# Patient Record
Sex: Female | Born: 1960
Health system: Southern US, Community
[De-identification: ages and names within clinical notes are randomized; demographics above are authoritative.]

---

## 2004-07-14 ENCOUNTER — Ambulatory Visit (HOSPITAL_BASED_OUTPATIENT_CLINIC_OR_DEPARTMENT_OTHER): Admission: RE | Admit: 2004-07-14 | Discharge: 2004-07-14 | Payer: Self-pay | Admitting: Urology

## 2004-07-14 ENCOUNTER — Ambulatory Visit (HOSPITAL_COMMUNITY): Admission: RE | Admit: 2004-07-14 | Discharge: 2004-07-14 | Payer: Self-pay | Admitting: Urology

## 2004-08-28 ENCOUNTER — Other Ambulatory Visit: Admission: RE | Admit: 2004-08-28 | Discharge: 2004-08-28 | Payer: Self-pay | Admitting: *Deleted

## 2005-09-10 ENCOUNTER — Other Ambulatory Visit: Admission: RE | Admit: 2005-09-10 | Discharge: 2005-09-10 | Payer: Self-pay | Admitting: *Deleted

## 2006-10-07 ENCOUNTER — Other Ambulatory Visit: Admission: RE | Admit: 2006-10-07 | Discharge: 2006-10-07 | Payer: Self-pay | Admitting: *Deleted

## 2007-10-13 ENCOUNTER — Other Ambulatory Visit: Admission: RE | Admit: 2007-10-13 | Discharge: 2007-10-13 | Payer: Self-pay | Admitting: *Deleted

## 2007-11-12 ENCOUNTER — Other Ambulatory Visit: Admission: RE | Admit: 2007-11-12 | Discharge: 2007-11-12 | Payer: Self-pay | Admitting: *Deleted

## 2008-05-10 ENCOUNTER — Other Ambulatory Visit: Admission: RE | Admit: 2008-05-10 | Discharge: 2008-05-10 | Payer: Self-pay | Admitting: Gynecology

## 2008-10-28 ENCOUNTER — Other Ambulatory Visit: Admission: RE | Admit: 2008-10-28 | Discharge: 2008-10-28 | Payer: Self-pay | Admitting: Gynecology

## 2010-11-05 ENCOUNTER — Encounter: Payer: Self-pay | Admitting: Family Medicine

## 2011-03-02 NOTE — Op Note (Signed)
NAMEMERON, BOCCHINO                ACCOUNT NO.:  [id number]   MEDICAL RECORD NO.:  [id number]          PATIENT TYPE:  AMB   LOCATION:  [facility]                         FACILITY:  [facility]   PHYSICIAN:  Jamison Neighbor, M.D.  DATE OF BIRTH:  01-23-1961   DATE OF PROCEDURE:  07/14/2004  DATE OF DISCHARGE:                                 OPERATIVE REPORT   SERVICE:  Urology.   PREOPERATIVE DIAGNOSES:  Interstitial cystitis.   POSTOPERATIVE DIAGNOSES:  Interstitial cystitis.   PROCEDURE:  Cystoscopy, urethral calibration, hydrodistention of the  bladder, Marcaine and Pyridium installation, Marcaine and Kenalog injection.   SURGEON:  Jamison Neighbor, M.D.   ANESTHESIA:  General.   COMPLICATIONS:  None.   DRAINS:  None.   BRIEF HISTORY:  This 50 year old female has had problems with lower urinary  tract symptoms of urgency, frequency and pain without positive cultures. The  patient was told in [place], [place] that she might have interstitial  cystitis and was given long-term antibiotics. She thought she might feel  better but clearly did not have any major improvement in her symptoms. Her  story did seem to be consistent with interstitial cystitis. She is  interested in diagnostic cystoscopy.  She understands the risks and benefits  of the procedure including the fact that she will certainly feel worse for  the first few days after the procedure and that there is no guarantee that  this will be helpful in terms of her long-term pain management. She gave  full and informed consent.   DESCRIPTION OF PROCEDURE:  After successful induction of general anesthesia,  the patient was placed in the dorsal lithotomy, prepped with Betadine and  draped in the usual sterile fashion. Careful bimanual examination was  performed. The patient had no significant cystocele, rectocele, or  enterocele. There was no prolapse, there was no discharge of any kind from  the vagina or urethra. There were no  masses on bimanual exam, there was no  signs of diverticulum. The urethra was calibrated to 46 [place] with no  evidence of stenosis or stricture. The cystoscope was inserted and the  bladder was carefully inspected and was free of any tumor or stones. Both  ureteral orifices were normal in configuration and location. Hydrodistention  of the bladder was then performed and the bladder was filled at a pressure  of 100 cmH2O for 5 minutes. When the bladder was drained, glomerulations  could be seen throughout the bladder and there was a terminal blood tinge at  the end of the drainage cycle and this had the classic appearance of  interstitial cystitis. What is remarkable, however, is that the patient has  a bladder capacity of 1300 mL which is significantly higher than even a  normal bladder capacity and obviously much higher than one generally sees  with this disease. Based on the patient's symptoms and the glomerulations,  one could certainly make a diagnosis of IC but obviously this is in the  early stages and is not a significant problem due to the normal bladder  capacity. The bladder  was  drained, a mixture of Marcaine and Pyridium was left within the bladder,  Marcaine and Kenalog were injected periurethrally. The patient received  intraoperative Toradol, Zofran and a B&O suppository. She will be sent home  with Tylox, Pyridium plus and doxycycline and return to see me in followup  in 2-3 weeks.      RJE/MEDQ  D:  07/14/2004  T:  07/14/2004  Job:  [id number]

## 2011-10-02 ENCOUNTER — Ambulatory Visit: Payer: BC Managed Care – PPO | Attending: Internal Medicine | Admitting: Physical Therapy

## 2011-10-02 DIAGNOSIS — R42 Dizziness and giddiness: Secondary | ICD-10-CM | POA: Insufficient documentation

## 2011-10-02 DIAGNOSIS — R269 Unspecified abnormalities of gait and mobility: Secondary | ICD-10-CM | POA: Insufficient documentation

## 2011-10-02 DIAGNOSIS — IMO0001 Reserved for inherently not codable concepts without codable children: Secondary | ICD-10-CM | POA: Insufficient documentation

## 2011-10-23 ENCOUNTER — Encounter: Payer: BC Managed Care – PPO | Admitting: Physical Therapy

## 2014-12-10 ENCOUNTER — Other Ambulatory Visit: Payer: Self-pay | Admitting: Internal Medicine

## 2014-12-10 ENCOUNTER — Ambulatory Visit
Admission: RE | Admit: 2014-12-10 | Discharge: 2014-12-10 | Disposition: A | Discharge status: Home / Self Care | Payer: BLUE CROSS/BLUE SHIELD | Source: Ambulatory Visit | Attending: Internal Medicine | Admitting: Internal Medicine

## 2014-12-10 DIAGNOSIS — M25551 Pain in right hip: Secondary | ICD-10-CM

## 2016-03-03 DIAGNOSIS — T07 Unspecified multiple injuries: Secondary | ICD-10-CM | POA: Diagnosis not present

## 2016-03-03 DIAGNOSIS — W57XXXA Bitten or stung by nonvenomous insect and other nonvenomous arthropods, initial encounter: Secondary | ICD-10-CM | POA: Diagnosis not present

## 2016-04-13 DIAGNOSIS — E785 Hyperlipidemia, unspecified: Secondary | ICD-10-CM | POA: Diagnosis not present

## 2016-04-19 DIAGNOSIS — E7800 Pure hypercholesterolemia, unspecified: Secondary | ICD-10-CM | POA: Diagnosis not present

## 2016-05-15 DIAGNOSIS — M6281 Muscle weakness (generalized): Secondary | ICD-10-CM | POA: Diagnosis not present

## 2016-05-15 DIAGNOSIS — M7711 Lateral epicondylitis, right elbow: Secondary | ICD-10-CM | POA: Diagnosis not present

## 2016-05-15 DIAGNOSIS — M7702 Medial epicondylitis, left elbow: Secondary | ICD-10-CM | POA: Diagnosis not present

## 2016-05-22 DIAGNOSIS — M7711 Lateral epicondylitis, right elbow: Secondary | ICD-10-CM | POA: Diagnosis not present

## 2016-05-22 DIAGNOSIS — M7702 Medial epicondylitis, left elbow: Secondary | ICD-10-CM | POA: Diagnosis not present

## 2016-05-22 DIAGNOSIS — M6281 Muscle weakness (generalized): Secondary | ICD-10-CM | POA: Diagnosis not present

## 2016-05-23 DIAGNOSIS — Z6825 Body mass index (BMI) 25.0-25.9, adult: Secondary | ICD-10-CM | POA: Diagnosis not present

## 2016-05-23 DIAGNOSIS — Z01419 Encounter for gynecological examination (general) (routine) without abnormal findings: Secondary | ICD-10-CM | POA: Diagnosis not present

## 2016-05-24 DIAGNOSIS — M7711 Lateral epicondylitis, right elbow: Secondary | ICD-10-CM | POA: Diagnosis not present

## 2016-05-24 DIAGNOSIS — M6281 Muscle weakness (generalized): Secondary | ICD-10-CM | POA: Diagnosis not present

## 2016-05-24 DIAGNOSIS — M7702 Medial epicondylitis, left elbow: Secondary | ICD-10-CM | POA: Diagnosis not present

## 2016-05-28 DIAGNOSIS — M7702 Medial epicondylitis, left elbow: Secondary | ICD-10-CM | POA: Diagnosis not present

## 2016-05-28 DIAGNOSIS — M7711 Lateral epicondylitis, right elbow: Secondary | ICD-10-CM | POA: Diagnosis not present

## 2016-05-28 DIAGNOSIS — M6281 Muscle weakness (generalized): Secondary | ICD-10-CM | POA: Diagnosis not present

## 2016-05-31 DIAGNOSIS — M7711 Lateral epicondylitis, right elbow: Secondary | ICD-10-CM | POA: Diagnosis not present

## 2016-05-31 DIAGNOSIS — M6281 Muscle weakness (generalized): Secondary | ICD-10-CM | POA: Diagnosis not present

## 2016-05-31 DIAGNOSIS — M7702 Medial epicondylitis, left elbow: Secondary | ICD-10-CM | POA: Diagnosis not present

## 2016-06-07 DIAGNOSIS — M6281 Muscle weakness (generalized): Secondary | ICD-10-CM | POA: Diagnosis not present

## 2016-06-07 DIAGNOSIS — M7711 Lateral epicondylitis, right elbow: Secondary | ICD-10-CM | POA: Diagnosis not present

## 2016-06-07 DIAGNOSIS — M7702 Medial epicondylitis, left elbow: Secondary | ICD-10-CM | POA: Diagnosis not present

## 2016-06-12 DIAGNOSIS — M7711 Lateral epicondylitis, right elbow: Secondary | ICD-10-CM | POA: Diagnosis not present

## 2016-06-12 DIAGNOSIS — M7702 Medial epicondylitis, left elbow: Secondary | ICD-10-CM | POA: Diagnosis not present

## 2016-06-12 DIAGNOSIS — M6281 Muscle weakness (generalized): Secondary | ICD-10-CM | POA: Diagnosis not present

## 2016-06-14 DIAGNOSIS — M6281 Muscle weakness (generalized): Secondary | ICD-10-CM | POA: Diagnosis not present

## 2016-06-14 DIAGNOSIS — M7702 Medial epicondylitis, left elbow: Secondary | ICD-10-CM | POA: Diagnosis not present

## 2016-06-14 DIAGNOSIS — M7711 Lateral epicondylitis, right elbow: Secondary | ICD-10-CM | POA: Diagnosis not present

## 2016-06-19 DIAGNOSIS — M6281 Muscle weakness (generalized): Secondary | ICD-10-CM | POA: Diagnosis not present

## 2016-06-19 DIAGNOSIS — M7711 Lateral epicondylitis, right elbow: Secondary | ICD-10-CM | POA: Diagnosis not present

## 2016-06-19 DIAGNOSIS — M7702 Medial epicondylitis, left elbow: Secondary | ICD-10-CM | POA: Diagnosis not present

## 2016-06-28 DIAGNOSIS — M7702 Medial epicondylitis, left elbow: Secondary | ICD-10-CM | POA: Diagnosis not present

## 2016-06-28 DIAGNOSIS — M6281 Muscle weakness (generalized): Secondary | ICD-10-CM | POA: Diagnosis not present

## 2016-06-28 DIAGNOSIS — M7711 Lateral epicondylitis, right elbow: Secondary | ICD-10-CM | POA: Diagnosis not present

## 2016-07-03 DIAGNOSIS — M6281 Muscle weakness (generalized): Secondary | ICD-10-CM | POA: Diagnosis not present

## 2016-07-03 DIAGNOSIS — M7702 Medial epicondylitis, left elbow: Secondary | ICD-10-CM | POA: Diagnosis not present

## 2016-07-03 DIAGNOSIS — M7711 Lateral epicondylitis, right elbow: Secondary | ICD-10-CM | POA: Diagnosis not present

## 2016-07-04 DIAGNOSIS — Z1231 Encounter for screening mammogram for malignant neoplasm of breast: Secondary | ICD-10-CM | POA: Diagnosis not present

## 2016-07-25 DIAGNOSIS — N390 Urinary tract infection, site not specified: Secondary | ICD-10-CM | POA: Diagnosis not present

## 2016-07-25 DIAGNOSIS — N3010 Interstitial cystitis (chronic) without hematuria: Secondary | ICD-10-CM | POA: Diagnosis not present

## 2016-07-31 DIAGNOSIS — M7711 Lateral epicondylitis, right elbow: Secondary | ICD-10-CM | POA: Diagnosis not present

## 2016-07-31 DIAGNOSIS — M6281 Muscle weakness (generalized): Secondary | ICD-10-CM | POA: Diagnosis not present

## 2016-07-31 DIAGNOSIS — M7702 Medial epicondylitis, left elbow: Secondary | ICD-10-CM | POA: Diagnosis not present

## 2016-08-28 DIAGNOSIS — Z23 Encounter for immunization: Secondary | ICD-10-CM | POA: Diagnosis not present

## 2016-12-13 DIAGNOSIS — M2012 Hallux valgus (acquired), left foot: Secondary | ICD-10-CM | POA: Diagnosis not present

## 2017-01-21 DIAGNOSIS — M2012 Hallux valgus (acquired), left foot: Secondary | ICD-10-CM | POA: Diagnosis not present

## 2017-02-04 DIAGNOSIS — M2012 Hallux valgus (acquired), left foot: Secondary | ICD-10-CM | POA: Diagnosis not present

## 2017-02-04 DIAGNOSIS — M21612 Bunion of left foot: Secondary | ICD-10-CM | POA: Diagnosis not present

## 2017-03-04 DIAGNOSIS — M2012 Hallux valgus (acquired), left foot: Secondary | ICD-10-CM | POA: Diagnosis not present

## 2017-03-13 DIAGNOSIS — R21 Rash and other nonspecific skin eruption: Secondary | ICD-10-CM | POA: Diagnosis not present

## 2017-04-15 DIAGNOSIS — L219 Seborrheic dermatitis, unspecified: Secondary | ICD-10-CM | POA: Diagnosis not present

## 2017-04-15 DIAGNOSIS — L719 Rosacea, unspecified: Secondary | ICD-10-CM | POA: Diagnosis not present

## 2017-05-16 DIAGNOSIS — M545 Low back pain: Secondary | ICD-10-CM | POA: Diagnosis not present

## 2017-05-16 DIAGNOSIS — M6281 Muscle weakness (generalized): Secondary | ICD-10-CM | POA: Diagnosis not present

## 2017-05-22 DIAGNOSIS — M545 Low back pain: Secondary | ICD-10-CM | POA: Diagnosis not present

## 2017-05-22 DIAGNOSIS — M6281 Muscle weakness (generalized): Secondary | ICD-10-CM | POA: Diagnosis not present

## 2017-05-27 DIAGNOSIS — M545 Low back pain: Secondary | ICD-10-CM | POA: Diagnosis not present

## 2017-05-27 DIAGNOSIS — M6281 Muscle weakness (generalized): Secondary | ICD-10-CM | POA: Diagnosis not present

## 2017-05-30 DIAGNOSIS — M6281 Muscle weakness (generalized): Secondary | ICD-10-CM | POA: Diagnosis not present

## 2017-05-30 DIAGNOSIS — M545 Low back pain: Secondary | ICD-10-CM | POA: Diagnosis not present

## 2017-07-09 DIAGNOSIS — Z1231 Encounter for screening mammogram for malignant neoplasm of breast: Secondary | ICD-10-CM | POA: Diagnosis not present

## 2017-07-18 DIAGNOSIS — Z0000 Encounter for general adult medical examination without abnormal findings: Secondary | ICD-10-CM | POA: Diagnosis not present

## 2017-07-18 DIAGNOSIS — E7800 Pure hypercholesterolemia, unspecified: Secondary | ICD-10-CM | POA: Diagnosis not present

## 2017-07-23 DIAGNOSIS — Z01419 Encounter for gynecological examination (general) (routine) without abnormal findings: Secondary | ICD-10-CM | POA: Diagnosis not present

## 2017-07-24 DIAGNOSIS — E7800 Pure hypercholesterolemia, unspecified: Secondary | ICD-10-CM | POA: Diagnosis not present

## 2017-07-24 DIAGNOSIS — F419 Anxiety disorder, unspecified: Secondary | ICD-10-CM | POA: Diagnosis not present

## 2017-07-24 DIAGNOSIS — Z23 Encounter for immunization: Secondary | ICD-10-CM | POA: Diagnosis not present

## 2017-07-24 DIAGNOSIS — N3010 Interstitial cystitis (chronic) without hematuria: Secondary | ICD-10-CM | POA: Diagnosis not present

## 2017-07-24 DIAGNOSIS — Z0000 Encounter for general adult medical examination without abnormal findings: Secondary | ICD-10-CM | POA: Diagnosis not present

## 2017-08-01 DIAGNOSIS — H52223 Regular astigmatism, bilateral: Secondary | ICD-10-CM | POA: Diagnosis not present

## 2017-08-07 DIAGNOSIS — R102 Pelvic and perineal pain: Secondary | ICD-10-CM | POA: Diagnosis not present

## 2017-08-07 DIAGNOSIS — N3020 Other chronic cystitis without hematuria: Secondary | ICD-10-CM | POA: Diagnosis not present

## 2017-09-02 DIAGNOSIS — N760 Acute vaginitis: Secondary | ICD-10-CM | POA: Diagnosis not present

## 2017-09-16 DIAGNOSIS — M545 Low back pain: Secondary | ICD-10-CM | POA: Diagnosis not present

## 2017-09-16 DIAGNOSIS — M6281 Muscle weakness (generalized): Secondary | ICD-10-CM | POA: Diagnosis not present

## 2017-09-18 DIAGNOSIS — M6281 Muscle weakness (generalized): Secondary | ICD-10-CM | POA: Diagnosis not present

## 2017-09-18 DIAGNOSIS — M545 Low back pain: Secondary | ICD-10-CM | POA: Diagnosis not present

## 2017-09-27 DIAGNOSIS — M545 Low back pain: Secondary | ICD-10-CM | POA: Diagnosis not present

## 2017-09-27 DIAGNOSIS — M6281 Muscle weakness (generalized): Secondary | ICD-10-CM | POA: Diagnosis not present

## 2017-10-01 DIAGNOSIS — D225 Melanocytic nevi of trunk: Secondary | ICD-10-CM | POA: Diagnosis not present

## 2017-10-01 DIAGNOSIS — L821 Other seborrheic keratosis: Secondary | ICD-10-CM | POA: Diagnosis not present

## 2017-10-01 DIAGNOSIS — Z86018 Personal history of other benign neoplasm: Secondary | ICD-10-CM | POA: Diagnosis not present

## 2017-10-01 DIAGNOSIS — L719 Rosacea, unspecified: Secondary | ICD-10-CM | POA: Diagnosis not present

## 2017-10-03 DIAGNOSIS — M6281 Muscle weakness (generalized): Secondary | ICD-10-CM | POA: Diagnosis not present

## 2017-10-03 DIAGNOSIS — M545 Low back pain: Secondary | ICD-10-CM | POA: Diagnosis not present

## 2017-10-17 DIAGNOSIS — M545 Low back pain: Secondary | ICD-10-CM | POA: Diagnosis not present

## 2017-10-17 DIAGNOSIS — M6281 Muscle weakness (generalized): Secondary | ICD-10-CM | POA: Diagnosis not present

## 2018-01-14 ENCOUNTER — Encounter (HOSPITAL_COMMUNITY): Payer: Self-pay | Admitting: Emergency Medicine

## 2018-01-14 ENCOUNTER — Emergency Department (HOSPITAL_COMMUNITY): Payer: BLUE CROSS/BLUE SHIELD

## 2018-01-14 ENCOUNTER — Emergency Department (HOSPITAL_COMMUNITY)
Admission: EM | Admit: 2018-01-14 | Discharge: 2018-01-14 | Disposition: A | Discharge status: Home / Self Care | Payer: BLUE CROSS/BLUE SHIELD | Attending: Emergency Medicine | Admitting: Emergency Medicine

## 2018-01-14 DIAGNOSIS — L309 Dermatitis, unspecified: Secondary | ICD-10-CM | POA: Diagnosis not present

## 2018-01-14 DIAGNOSIS — L810 Postinflammatory hyperpigmentation: Secondary | ICD-10-CM | POA: Diagnosis not present

## 2018-01-14 DIAGNOSIS — R200 Anesthesia of skin: Secondary | ICD-10-CM | POA: Diagnosis present

## 2018-01-14 DIAGNOSIS — S0990XA Unspecified injury of head, initial encounter: Secondary | ICD-10-CM | POA: Diagnosis not present

## 2018-01-14 DIAGNOSIS — R202 Paresthesia of skin: Secondary | ICD-10-CM | POA: Diagnosis not present

## 2018-01-14 DIAGNOSIS — L821 Other seborrheic keratosis: Secondary | ICD-10-CM | POA: Diagnosis not present

## 2018-01-14 DIAGNOSIS — L719 Rosacea, unspecified: Secondary | ICD-10-CM | POA: Diagnosis not present

## 2018-01-14 MED ORDER — METHOCARBAMOL 500 MG PO TABS: 500.0000 mg | ORAL_TABLET | Freq: Two times a day (BID) | ORAL | 0 refills | Status: DC
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
## 2018-01-14 NOTE — Discharge Instructions (Addendum)
You may experience some muscle strain and pain over the next few days, you may take Ibuprofen and Robaxin as needed for pain management. Do not combine with any pain reliever other than tylenol. The muscle soreness should improve over the next week. Follow up with your family doctor in the next week for a recheck if you are still having symptoms. Return to ED if pain is worsening, you develop weakness or numbness of extremities, or new or concerning symptoms develop.  You were examined today for a head injury, your head CT showed no evidence of injury today.   Sometimes serious problems can develop after a head injury. Please return to the emergency department if you experience any of the following symptoms: Repeated vomiting Headache that gets worse and does not go away Loss of consciousness or inability to stay awake at times when you normally would be able to Getting more confused, restless or agitated Convulsions or seizures Difficulty walking or feeling off balance Weakness or numbness Vision changes
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
## 2018-02-18 DIAGNOSIS — S86111A Strain of other muscle(s) and tendon(s) of posterior muscle group at lower leg level, right leg, initial encounter: Secondary | ICD-10-CM | POA: Diagnosis not present

## 2018-02-18 DIAGNOSIS — M79661 Pain in right lower leg: Secondary | ICD-10-CM | POA: Diagnosis not present

## 2018-05-13 DIAGNOSIS — L719 Rosacea, unspecified: Secondary | ICD-10-CM | POA: Diagnosis not present

## 2018-07-17 DIAGNOSIS — Z1231 Encounter for screening mammogram for malignant neoplasm of breast: Secondary | ICD-10-CM | POA: Diagnosis not present

## 2018-07-23 DIAGNOSIS — E7800 Pure hypercholesterolemia, unspecified: Secondary | ICD-10-CM | POA: Diagnosis not present

## 2018-07-25 DIAGNOSIS — F418 Other specified anxiety disorders: Secondary | ICD-10-CM | POA: Diagnosis not present

## 2018-07-25 DIAGNOSIS — E7800 Pure hypercholesterolemia, unspecified: Secondary | ICD-10-CM | POA: Diagnosis not present

## 2018-07-25 DIAGNOSIS — Z0000 Encounter for general adult medical examination without abnormal findings: Secondary | ICD-10-CM | POA: Diagnosis not present

## 2018-08-07 ENCOUNTER — Other Ambulatory Visit: Payer: Self-pay | Admitting: Internal Medicine

## 2018-08-07 DIAGNOSIS — E7800 Pure hypercholesterolemia, unspecified: Secondary | ICD-10-CM

## 2018-08-11 ENCOUNTER — Ambulatory Visit
Admission: RE | Admit: 2018-08-11 | Discharge: 2018-08-11 | Disposition: A | Discharge status: Home / Self Care | Payer: BLUE CROSS/BLUE SHIELD | Source: Ambulatory Visit | Attending: Internal Medicine | Admitting: Internal Medicine

## 2018-08-11 DIAGNOSIS — E785 Hyperlipidemia, unspecified: Secondary | ICD-10-CM | POA: Diagnosis not present

## 2018-08-11 DIAGNOSIS — E7800 Pure hypercholesterolemia, unspecified: Secondary | ICD-10-CM

## 2018-08-13 DIAGNOSIS — H52223 Regular astigmatism, bilateral: Secondary | ICD-10-CM | POA: Diagnosis not present

## 2018-11-11 DIAGNOSIS — D225 Melanocytic nevi of trunk: Secondary | ICD-10-CM | POA: Diagnosis not present

## 2018-11-11 DIAGNOSIS — L719 Rosacea, unspecified: Secondary | ICD-10-CM | POA: Diagnosis not present

## 2018-11-11 DIAGNOSIS — Z86018 Personal history of other benign neoplasm: Secondary | ICD-10-CM | POA: Diagnosis not present

## 2018-11-11 DIAGNOSIS — L821 Other seborrheic keratosis: Secondary | ICD-10-CM | POA: Diagnosis not present
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
## 2018-12-02 DIAGNOSIS — N760 Acute vaginitis: Secondary | ICD-10-CM | POA: Diagnosis not present

## 2018-12-04 DIAGNOSIS — N3010 Interstitial cystitis (chronic) without hematuria: Secondary | ICD-10-CM | POA: Diagnosis not present

## 2018-12-15 DIAGNOSIS — M6281 Muscle weakness (generalized): Secondary | ICD-10-CM | POA: Diagnosis not present

## 2018-12-15 DIAGNOSIS — M62838 Other muscle spasm: Secondary | ICD-10-CM | POA: Diagnosis not present

## 2018-12-15 DIAGNOSIS — R102 Pelvic and perineal pain: Secondary | ICD-10-CM | POA: Diagnosis not present

## 2018-12-15 DIAGNOSIS — R300 Dysuria: Secondary | ICD-10-CM | POA: Diagnosis not present

## 2018-12-16 DIAGNOSIS — R300 Dysuria: Secondary | ICD-10-CM | POA: Diagnosis not present

## 2018-12-16 DIAGNOSIS — M6281 Muscle weakness (generalized): Secondary | ICD-10-CM | POA: Diagnosis not present

## 2018-12-16 DIAGNOSIS — M62838 Other muscle spasm: Secondary | ICD-10-CM | POA: Diagnosis not present

## 2018-12-16 DIAGNOSIS — R102 Pelvic and perineal pain: Secondary | ICD-10-CM | POA: Diagnosis not present

## 2019-07-02 ENCOUNTER — Ambulatory Visit: Payer: BC Managed Care – PPO | Admitting: Podiatry

## 2019-07-02 ENCOUNTER — Encounter: Payer: Self-pay | Admitting: Podiatry

## 2019-07-02 ENCOUNTER — Other Ambulatory Visit: Payer: Self-pay

## 2019-07-02 VITALS — BP 123/77 | HR 57

## 2019-07-02 DIAGNOSIS — M2042 Other hammer toe(s) (acquired), left foot: Secondary | ICD-10-CM | POA: Diagnosis not present

## 2019-07-02 DIAGNOSIS — M779 Enthesopathy, unspecified: Secondary | ICD-10-CM | POA: Diagnosis not present

## 2019-07-06 NOTE — Progress Notes (Signed)
Subjective:   Patient ID: Jennifer Garza, female   DOB: 58 y.o.   MRN: [id number]   HPI Patient states having quite a bit of pain between the big toe second toe the left foot for a number of months especially with exercising.  She has history of bunion correction performed in [place] a number of years ago and states this is become ongoing as far as problems go.  Patient does not smoke likes to be active   Review of Systems  All other systems reviewed and are negative.       Objective:  Physical Exam Vitals signs and nursing note reviewed.  Constitutional:      Appearance: She is well-developed.  Pulmonary:     Effort: Pulmonary effort is normal.  Musculoskeletal: Normal range of motion.  Skin:    General: Skin is warm.  Neurological:     Mental Status: She is alert.     Neurovascular status intact muscle strength found to be adequate range of motion within normal limits with patient found to have an inner inflammation of the left second digit inner phalangeal joint with fluid buildup keratotic lesion with also small lesion on the big toe     Assessment:  Inflammatory capsulitis of the inner phalangeal joint left second digit and hallux with moderate pressure between the 2 with scar of the left first metatarsal from bunion correction       Plan:  Inflammatory capsulitis interphalangeal joint digit to left with keratotic tissue and lesion formation H&P reviewed condition and at this point went ahead and recommended careful injection treatment with sterile prep applied injected the joint 2 mg dexamethasone 2 mg Xylocaine debrided lesion big toe second toe applied padding and discussed possible surgical intervention if symptoms were to return quickly  X-rays indicate there is mild compression between the hallux second toe left

## 2019-07-18 ENCOUNTER — Encounter (INDEPENDENT_AMBULATORY_CARE_PROVIDER_SITE_OTHER): Payer: Self-pay

## 2019-07-18 ENCOUNTER — Telehealth: Payer: BLUE CROSS/BLUE SHIELD | Admitting: Nurse Practitioner

## 2019-07-18 DIAGNOSIS — Z20822 Contact with and (suspected) exposure to covid-19: Secondary | ICD-10-CM

## 2019-07-18 MED ORDER — BENZONATATE 100 MG PO CAPS: 100.0000 mg | ORAL_CAPSULE | Freq: Three times a day (TID) | ORAL | 0 refills | Status: DC | PRN

## 2019-07-18 NOTE — Progress Notes (Signed)
E-Visit for Corona Virus Screening   Your current symptoms could be consistent with the coronavirus.  Many health care providers can now test patients at their office but not all are.  [facility] has multiple testing sites. For information on our COVID testing locations and hours go to [email or web address]  Please quarantine yourself while awaiting your test results.  We are enrolling you in our MyChart Home Montioring for COVID19 . Daily you will receive a questionnaire within the MyChart website. Our COVID 19 response team willl be monitoriing your responses daily.  You can go to one of the  testing sites listed below, while they are opened (see hours). You do not need a doctors order to be tested for covid.You do need to self-isolate until your results return and if positive 14 days from when your symptoms started and until you are 3 days symptom free.   Testing Locations (Monday - Friday, 8 a.m. - 3:30 p.m.) . [place]: [facility], [address], [place], [place]  . [place]: [facility], [address], [place], [place] (entrance off [address])  . [place]: [address], [place], [place] (across from [facility])    COVID-19 is a respiratory illness with symptoms that are similar to the flu. Symptoms are typically mild to moderate, but there have been cases of severe illness and death due to the virus. The following symptoms may appear 2-14 days after exposure: . Fever . Cough . Shortness of breath or difficulty breathing . Chills . Repeated shaking with chills . Muscle pain . Headache . Sore throat . New loss of taste or smell . Fatigue . Congestion or runny nose . Nausea or vomiting . Diarrhea  It is vitally important that if you feel that you have an infection such as this virus or any other virus that you stay home and away from places where you may  spread it to others.  You should self-quarantine for 14 days if you have symptoms that could potentially be coronavirus or have been in close contact a with a person diagnosed with COVID-19 within the last 2 weeks. You should avoid contact with people age 65 and older.   You should wear a mask or cloth face covering over your nose and mouth if you must be around other people or animals, including pets (even at home). Try to stay at least 6 feet away from other people. This will protect the people around you.  You can use medication such as A prescription cough medication called Tessalon Perles 100 mg. You may take 1-2 capsules every 8 hours as needed for cough  You may also take acetaminophen (Tylenol) as needed for fever.   Reduce your risk of any infection by using the same precautions used for avoiding the common cold or flu:  . Wash your hands often with soap and warm water for at least 20 seconds.  If soap and water are not readily available, use an alcohol-based hand sanitizer with at least 60% alcohol.  . If coughing or sneezing, cover your mouth and nose by coughing or sneezing into the elbow areas of your shirt or coat, into a tissue or into your sleeve (not your hands). . Avoid shaking hands with others and consider head nods or verbal greetings only. . Avoid touching your eyes, nose, or mouth with unwashed hands.  . Avoid close contact with people who are sick. . Avoid places or   events with large numbers of people in one location, like concerts or sporting events. . Carefully consider travel plans you have or are making. . If you are planning any travel outside or inside the [place], visit the CDC's Travelers' Health webpage for the latest health notices. . If you have some symptoms but not all symptoms, continue to monitor at home and seek medical attention if your symptoms worsen. . If you are having a medical emergency, call 911.  HOME CARE . Only take medications as instructed by your  medical team. . Drink plenty of fluids and get plenty of rest. . A steam or ultrasonic humidifier can help if you have congestion.   GET HELP RIGHT AWAY IF YOU HAVE EMERGENCY WARNING SIGNS** FOR COVID-19. If you or someone is showing any of these signs seek emergency medical care immediately. Call 911 or proceed to your closest emergency facility if: . You develop worsening high fever. . Trouble breathing . Bluish lips or face . Persistent pain or pressure in the chest . New confusion . Inability to wake or stay awake . You cough up blood. . Your symptoms become more severe  **This list is not all possible symptoms. Contact your medical provider for any symptoms that are sever or concerning to you.   MAKE SURE YOU   Understand these instructions.  Will watch your condition.  Will get help right away if you are not doing well or get worse.  Your e-visit answers were reviewed by a board certified advanced clinical practitioner to complete your personal care plan.  Depending on the condition, your plan could have included both over the counter or prescription medications.  If there is a problem please reply once you have received a response from your provider.  Your safety is important to us.  If you have drug allergies check your prescription carefully.    You can use MyChart to ask questions about today's visit, request a non-urgent call back, or ask for a work or school excuse for 24 hours related to this e-Visit. If it has been greater than 24 hours you will need to follow up with your provider, or enter a new e-Visit to address those concerns. You will get an e-mail in the next two days asking about your experience.  I hope that your e-visit has been valuable and will speed your recovery. Thank you for using e-visits.   5-10 minutes spent reviewing and documenting in chart.  

## 2019-07-19 ENCOUNTER — Encounter (INDEPENDENT_AMBULATORY_CARE_PROVIDER_SITE_OTHER): Payer: Self-pay

## 2019-07-20 ENCOUNTER — Encounter (INDEPENDENT_AMBULATORY_CARE_PROVIDER_SITE_OTHER): Payer: Self-pay

## 2019-07-22 ENCOUNTER — Encounter (INDEPENDENT_AMBULATORY_CARE_PROVIDER_SITE_OTHER): Payer: Self-pay

## 2019-07-24 ENCOUNTER — Encounter (INDEPENDENT_AMBULATORY_CARE_PROVIDER_SITE_OTHER): Payer: Self-pay

## 2019-07-24 DIAGNOSIS — E7800 Pure hypercholesterolemia, unspecified: Secondary | ICD-10-CM | POA: Diagnosis not present

## 2019-07-24 DIAGNOSIS — Z1159 Encounter for screening for other viral diseases: Secondary | ICD-10-CM | POA: Diagnosis not present

## 2019-07-29 DIAGNOSIS — Z0000 Encounter for general adult medical examination without abnormal findings: Secondary | ICD-10-CM | POA: Diagnosis not present

## 2019-08-07 DIAGNOSIS — M6281 Muscle weakness (generalized): Secondary | ICD-10-CM | POA: Diagnosis not present

## 2019-08-07 DIAGNOSIS — M25552 Pain in left hip: Secondary | ICD-10-CM | POA: Diagnosis not present

## 2019-08-11 DIAGNOSIS — M25552 Pain in left hip: Secondary | ICD-10-CM | POA: Diagnosis not present

## 2019-08-11 DIAGNOSIS — M6281 Muscle weakness (generalized): Secondary | ICD-10-CM | POA: Diagnosis not present

## 2019-08-20 DIAGNOSIS — Z1231 Encounter for screening mammogram for malignant neoplasm of breast: Secondary | ICD-10-CM | POA: Diagnosis not present

## 2019-08-25 DIAGNOSIS — M6281 Muscle weakness (generalized): Secondary | ICD-10-CM | POA: Diagnosis not present

## 2019-08-25 DIAGNOSIS — M25552 Pain in left hip: Secondary | ICD-10-CM | POA: Diagnosis not present

## 2019-08-26 DIAGNOSIS — H52223 Regular astigmatism, bilateral: Secondary | ICD-10-CM | POA: Diagnosis not present

## 2019-09-06 ENCOUNTER — Other Ambulatory Visit: Payer: Self-pay | Admitting: Cardiology

## 2019-09-06 DIAGNOSIS — Z20822 Contact with and (suspected) exposure to covid-19: Secondary | ICD-10-CM

## 2019-09-07 LAB — NOVEL CORONAVIRUS, NAA: SARS-CoV-2, NAA: NOT DETECTED

## 2019-09-17 DIAGNOSIS — M25552 Pain in left hip: Secondary | ICD-10-CM | POA: Diagnosis not present

## 2019-09-17 DIAGNOSIS — M6281 Muscle weakness (generalized): Secondary | ICD-10-CM | POA: Diagnosis not present

## 2019-10-01 DIAGNOSIS — M6281 Muscle weakness (generalized): Secondary | ICD-10-CM | POA: Diagnosis not present

## 2019-10-01 DIAGNOSIS — M25552 Pain in left hip: Secondary | ICD-10-CM | POA: Diagnosis not present

## 2019-11-16 DIAGNOSIS — L821 Other seborrheic keratosis: Secondary | ICD-10-CM | POA: Diagnosis not present

## 2019-11-16 DIAGNOSIS — D225 Melanocytic nevi of trunk: Secondary | ICD-10-CM | POA: Diagnosis not present

## 2019-11-16 DIAGNOSIS — L814 Other melanin hyperpigmentation: Secondary | ICD-10-CM | POA: Diagnosis not present

## 2019-11-16 DIAGNOSIS — Z86018 Personal history of other benign neoplasm: Secondary | ICD-10-CM | POA: Diagnosis not present

## 2019-11-24 DIAGNOSIS — Z20828 Contact with and (suspected) exposure to other viral communicable diseases: Secondary | ICD-10-CM | POA: Diagnosis not present

## 2019-11-25 ENCOUNTER — Other Ambulatory Visit: Payer: Self-pay

## 2019-11-26 ENCOUNTER — Other Ambulatory Visit: Payer: Self-pay

## 2019-11-27 DIAGNOSIS — Z20828 Contact with and (suspected) exposure to other viral communicable diseases: Secondary | ICD-10-CM | POA: Diagnosis not present

## 2019-12-13 DIAGNOSIS — N3090 Cystitis, unspecified without hematuria: Secondary | ICD-10-CM | POA: Diagnosis not present

## 2019-12-26 ENCOUNTER — Ambulatory Visit: Payer: Self-pay

## 2019-12-26 ENCOUNTER — Ambulatory Visit: Payer: BC Managed Care – PPO | Attending: Internal Medicine

## 2019-12-26 DIAGNOSIS — Z23 Encounter for immunization: Secondary | ICD-10-CM

## 2019-12-26 NOTE — Progress Notes (Signed)
   Covid-19 Vaccination Clinic  Name:  Jennifer Garza    MRN: [id number] DOB: 06-30-1961  12/26/2019  [name] was observed post Covid-19 immunization for 30 minutes based on pre-vaccination screening without incident. She was provided with Vaccine Information Sheet and instruction to access the V-Safe system.   [name] was instructed to call 911 with any severe reactions post vaccine: Marland Kitchen Difficulty breathing  . Swelling of face and throat  . A fast heartbeat  . A bad rash all over body  . Dizziness and weakness   Immunizations Administered    Name Date Dose VIS Date Route   Pfizer COVID-19 Vaccine 12/26/2019  2:39 PM 0.3 mL 09/25/2019 Intramuscular   Manufacturer: [employer], [employer]   Lot: [id number]   NDC: [address]-5

## 2020-01-19 ENCOUNTER — Ambulatory Visit: Payer: BC Managed Care – PPO | Attending: Internal Medicine

## 2020-01-19 DIAGNOSIS — Z23 Encounter for immunization: Secondary | ICD-10-CM

## 2020-01-19 NOTE — Progress Notes (Signed)
   Covid-19 Vaccination Clinic  Name:  Saadiya Wilfong    MRN: [id number] DOB: December 03, 1960  01/19/2020  Ms. Tatlock was observed post Covid-19 immunization for 15 minutes without incident. She was provided with Vaccine Information Sheet and instruction to access the V-Safe system.   Ms. Tays was instructed to call 911 with any severe reactions post vaccine: Marland Kitchen Difficulty breathing  . Swelling of face and throat  . A fast heartbeat  . A bad rash all over body  . Dizziness and weakness   Immunizations Administered    Name Date Dose VIS Date Route   Pfizer COVID-19 Vaccine 01/19/2020  2:59 PM 0.3 mL 09/25/2019 Intramuscular   Manufacturer: [employer], [employer]   Lot: [id number]   NDC: [address]-8

## 2020-01-27 DIAGNOSIS — Z01419 Encounter for gynecological examination (general) (routine) without abnormal findings: Secondary | ICD-10-CM | POA: Diagnosis not present

## 2020-01-27 DIAGNOSIS — Z6826 Body mass index (BMI) 26.0-26.9, adult: Secondary | ICD-10-CM | POA: Diagnosis not present

## 2020-07-05 DIAGNOSIS — N390 Urinary tract infection, site not specified: Secondary | ICD-10-CM | POA: Diagnosis not present

## 2020-07-18 DIAGNOSIS — E7800 Pure hypercholesterolemia, unspecified: Secondary | ICD-10-CM | POA: Diagnosis not present

## 2020-07-18 DIAGNOSIS — Z0000 Encounter for general adult medical examination without abnormal findings: Secondary | ICD-10-CM | POA: Diagnosis not present

## 2020-07-19 ENCOUNTER — Other Ambulatory Visit: Payer: BC Managed Care – PPO

## 2020-07-21 DIAGNOSIS — L0390 Cellulitis, unspecified: Secondary | ICD-10-CM | POA: Diagnosis not present

## 2020-07-22 DIAGNOSIS — Z20822 Contact with and (suspected) exposure to covid-19: Secondary | ICD-10-CM | POA: Diagnosis not present

## 2020-07-29 DIAGNOSIS — Z0000 Encounter for general adult medical examination without abnormal findings: Secondary | ICD-10-CM | POA: Diagnosis not present

## 2020-07-29 DIAGNOSIS — F419 Anxiety disorder, unspecified: Secondary | ICD-10-CM | POA: Diagnosis not present

## 2020-07-29 DIAGNOSIS — E7800 Pure hypercholesterolemia, unspecified: Secondary | ICD-10-CM | POA: Diagnosis not present

## 2020-07-29 DIAGNOSIS — Z23 Encounter for immunization: Secondary | ICD-10-CM | POA: Diagnosis not present

## 2020-08-25 DIAGNOSIS — Z1231 Encounter for screening mammogram for malignant neoplasm of breast: Secondary | ICD-10-CM | POA: Diagnosis not present

## 2020-08-26 DIAGNOSIS — H5213 Myopia, bilateral: Secondary | ICD-10-CM | POA: Diagnosis not present

## 2020-10-17 DIAGNOSIS — N3011 Interstitial cystitis (chronic) with hematuria: Secondary | ICD-10-CM | POA: Diagnosis not present

## 2020-10-20 DIAGNOSIS — R300 Dysuria: Secondary | ICD-10-CM | POA: Diagnosis not present

## 2020-11-15 DIAGNOSIS — L814 Other melanin hyperpigmentation: Secondary | ICD-10-CM | POA: Diagnosis not present

## 2020-11-15 DIAGNOSIS — L821 Other seborrheic keratosis: Secondary | ICD-10-CM | POA: Diagnosis not present

## 2020-11-15 DIAGNOSIS — D225 Melanocytic nevi of trunk: Secondary | ICD-10-CM | POA: Diagnosis not present

## 2020-11-15 DIAGNOSIS — Z86018 Personal history of other benign neoplasm: Secondary | ICD-10-CM | POA: Diagnosis not present

## 2021-04-21 DIAGNOSIS — M542 Cervicalgia: Secondary | ICD-10-CM | POA: Diagnosis not present

## 2021-04-21 DIAGNOSIS — M25512 Pain in left shoulder: Secondary | ICD-10-CM | POA: Diagnosis not present

## 2021-04-27 DIAGNOSIS — M2560 Stiffness of unspecified joint, not elsewhere classified: Secondary | ICD-10-CM | POA: Diagnosis not present

## 2021-04-27 DIAGNOSIS — M50122 Cervical disc disorder at C5-C6 level with radiculopathy: Secondary | ICD-10-CM | POA: Diagnosis not present

## 2021-04-27 DIAGNOSIS — M25512 Pain in left shoulder: Secondary | ICD-10-CM | POA: Diagnosis not present

## 2021-05-04 DIAGNOSIS — M50122 Cervical disc disorder at C5-C6 level with radiculopathy: Secondary | ICD-10-CM | POA: Diagnosis not present

## 2021-05-04 DIAGNOSIS — M2560 Stiffness of unspecified joint, not elsewhere classified: Secondary | ICD-10-CM | POA: Diagnosis not present

## 2021-05-04 DIAGNOSIS — M25512 Pain in left shoulder: Secondary | ICD-10-CM | POA: Diagnosis not present

## 2021-05-07 DIAGNOSIS — H01005 Unspecified blepharitis left lower eyelid: Secondary | ICD-10-CM | POA: Diagnosis not present

## 2021-05-08 DIAGNOSIS — M50122 Cervical disc disorder at C5-C6 level with radiculopathy: Secondary | ICD-10-CM | POA: Diagnosis not present

## 2021-05-08 DIAGNOSIS — M2560 Stiffness of unspecified joint, not elsewhere classified: Secondary | ICD-10-CM | POA: Diagnosis not present

## 2021-05-08 DIAGNOSIS — M25512 Pain in left shoulder: Secondary | ICD-10-CM | POA: Diagnosis not present

## 2021-05-15 DIAGNOSIS — M2560 Stiffness of unspecified joint, not elsewhere classified: Secondary | ICD-10-CM | POA: Diagnosis not present

## 2021-05-15 DIAGNOSIS — M25512 Pain in left shoulder: Secondary | ICD-10-CM | POA: Diagnosis not present

## 2021-05-15 DIAGNOSIS — M50122 Cervical disc disorder at C5-C6 level with radiculopathy: Secondary | ICD-10-CM | POA: Diagnosis not present

## 2021-05-24 DIAGNOSIS — D23122 Other benign neoplasm of skin of left lower eyelid, including canthus: Secondary | ICD-10-CM | POA: Diagnosis not present

## 2021-05-25 DIAGNOSIS — M25512 Pain in left shoulder: Secondary | ICD-10-CM | POA: Diagnosis not present

## 2021-05-25 DIAGNOSIS — M2560 Stiffness of unspecified joint, not elsewhere classified: Secondary | ICD-10-CM | POA: Diagnosis not present

## 2021-05-25 DIAGNOSIS — M50122 Cervical disc disorder at C5-C6 level with radiculopathy: Secondary | ICD-10-CM | POA: Diagnosis not present

## 2021-05-30 DIAGNOSIS — H0015 Chalazion left lower eyelid: Secondary | ICD-10-CM | POA: Diagnosis not present

## 2021-06-01 DIAGNOSIS — M2560 Stiffness of unspecified joint, not elsewhere classified: Secondary | ICD-10-CM | POA: Diagnosis not present

## 2021-06-01 DIAGNOSIS — M50122 Cervical disc disorder at C5-C6 level with radiculopathy: Secondary | ICD-10-CM | POA: Diagnosis not present

## 2021-06-01 DIAGNOSIS — M25512 Pain in left shoulder: Secondary | ICD-10-CM | POA: Diagnosis not present

## 2021-06-07 DIAGNOSIS — M50122 Cervical disc disorder at C5-C6 level with radiculopathy: Secondary | ICD-10-CM | POA: Diagnosis not present

## 2021-06-07 DIAGNOSIS — M25512 Pain in left shoulder: Secondary | ICD-10-CM | POA: Diagnosis not present

## 2021-06-07 DIAGNOSIS — M2560 Stiffness of unspecified joint, not elsewhere classified: Secondary | ICD-10-CM | POA: Diagnosis not present

## 2021-06-14 DIAGNOSIS — H0015 Chalazion left lower eyelid: Secondary | ICD-10-CM | POA: Diagnosis not present

## 2021-07-27 DIAGNOSIS — H0015 Chalazion left lower eyelid: Secondary | ICD-10-CM | POA: Diagnosis not present

## 2021-08-10 DIAGNOSIS — H0015 Chalazion left lower eyelid: Secondary | ICD-10-CM | POA: Diagnosis not present

## 2021-08-29 DIAGNOSIS — Z6826 Body mass index (BMI) 26.0-26.9, adult: Secondary | ICD-10-CM | POA: Diagnosis not present

## 2021-08-29 DIAGNOSIS — Z01419 Encounter for gynecological examination (general) (routine) without abnormal findings: Secondary | ICD-10-CM | POA: Diagnosis not present

## 2021-08-29 DIAGNOSIS — M2560 Stiffness of unspecified joint, not elsewhere classified: Secondary | ICD-10-CM | POA: Diagnosis not present

## 2021-08-29 DIAGNOSIS — M50122 Cervical disc disorder at C5-C6 level with radiculopathy: Secondary | ICD-10-CM | POA: Diagnosis not present

## 2021-08-29 DIAGNOSIS — M25512 Pain in left shoulder: Secondary | ICD-10-CM | POA: Diagnosis not present

## 2021-08-31 DIAGNOSIS — Z1231 Encounter for screening mammogram for malignant neoplasm of breast: Secondary | ICD-10-CM | POA: Diagnosis not present

## 2021-09-06 DIAGNOSIS — M2560 Stiffness of unspecified joint, not elsewhere classified: Secondary | ICD-10-CM | POA: Diagnosis not present

## 2021-09-06 DIAGNOSIS — M25512 Pain in left shoulder: Secondary | ICD-10-CM | POA: Diagnosis not present

## 2021-09-06 DIAGNOSIS — M50122 Cervical disc disorder at C5-C6 level with radiculopathy: Secondary | ICD-10-CM | POA: Diagnosis not present

## 2021-09-11 DIAGNOSIS — E038 Other specified hypothyroidism: Secondary | ICD-10-CM | POA: Diagnosis not present

## 2021-09-11 DIAGNOSIS — M50122 Cervical disc disorder at C5-C6 level with radiculopathy: Secondary | ICD-10-CM | POA: Diagnosis not present

## 2021-09-11 DIAGNOSIS — E7800 Pure hypercholesterolemia, unspecified: Secondary | ICD-10-CM | POA: Diagnosis not present

## 2021-09-11 DIAGNOSIS — M25512 Pain in left shoulder: Secondary | ICD-10-CM | POA: Diagnosis not present

## 2021-09-11 DIAGNOSIS — R946 Abnormal results of thyroid function studies: Secondary | ICD-10-CM | POA: Diagnosis not present

## 2021-09-11 DIAGNOSIS — M2560 Stiffness of unspecified joint, not elsewhere classified: Secondary | ICD-10-CM | POA: Diagnosis not present

## 2021-09-14 DIAGNOSIS — F419 Anxiety disorder, unspecified: Secondary | ICD-10-CM | POA: Diagnosis not present

## 2021-09-14 DIAGNOSIS — E7800 Pure hypercholesterolemia, unspecified: Secondary | ICD-10-CM | POA: Diagnosis not present

## 2021-09-14 DIAGNOSIS — R946 Abnormal results of thyroid function studies: Secondary | ICD-10-CM | POA: Diagnosis not present

## 2021-09-14 DIAGNOSIS — Z0000 Encounter for general adult medical examination without abnormal findings: Secondary | ICD-10-CM | POA: Diagnosis not present

## 2021-09-14 DIAGNOSIS — N3010 Interstitial cystitis (chronic) without hematuria: Secondary | ICD-10-CM | POA: Diagnosis not present

## 2021-10-03 DIAGNOSIS — M50122 Cervical disc disorder at C5-C6 level with radiculopathy: Secondary | ICD-10-CM | POA: Diagnosis not present

## 2021-10-03 DIAGNOSIS — M2560 Stiffness of unspecified joint, not elsewhere classified: Secondary | ICD-10-CM | POA: Diagnosis not present

## 2021-10-03 DIAGNOSIS — M25512 Pain in left shoulder: Secondary | ICD-10-CM | POA: Diagnosis not present

## 2021-10-27 ENCOUNTER — Ambulatory Visit
Admission: RE | Admit: 2021-10-27 | Discharge: 2021-10-27 | Disposition: A | Discharge status: Home / Self Care | Payer: BLUE CROSS/BLUE SHIELD | Source: Ambulatory Visit | Attending: Internal Medicine | Admitting: Internal Medicine

## 2021-10-27 ENCOUNTER — Other Ambulatory Visit: Payer: Self-pay | Admitting: Internal Medicine

## 2021-10-27 DIAGNOSIS — R102 Pelvic and perineal pain: Secondary | ICD-10-CM

## 2021-11-21 DIAGNOSIS — Z86018 Personal history of other benign neoplasm: Secondary | ICD-10-CM | POA: Diagnosis not present

## 2021-11-21 DIAGNOSIS — L821 Other seborrheic keratosis: Secondary | ICD-10-CM | POA: Diagnosis not present

## 2021-11-21 DIAGNOSIS — D225 Melanocytic nevi of trunk: Secondary | ICD-10-CM | POA: Diagnosis not present

## 2021-11-21 DIAGNOSIS — L814 Other melanin hyperpigmentation: Secondary | ICD-10-CM | POA: Diagnosis not present

## 2021-11-28 DIAGNOSIS — U071 COVID-19: Secondary | ICD-10-CM | POA: Diagnosis not present

## 2021-12-12 DIAGNOSIS — E7800 Pure hypercholesterolemia, unspecified: Secondary | ICD-10-CM | POA: Diagnosis not present

## 2021-12-12 DIAGNOSIS — R946 Abnormal results of thyroid function studies: Secondary | ICD-10-CM | POA: Diagnosis not present

## 2021-12-12 DIAGNOSIS — Z5181 Encounter for therapeutic drug level monitoring: Secondary | ICD-10-CM | POA: Diagnosis not present

## 2021-12-18 DIAGNOSIS — J0100 Acute maxillary sinusitis, unspecified: Secondary | ICD-10-CM | POA: Diagnosis not present

## 2022-01-29 DIAGNOSIS — Z5181 Encounter for therapeutic drug level monitoring: Secondary | ICD-10-CM | POA: Diagnosis not present

## 2022-01-29 DIAGNOSIS — E7800 Pure hypercholesterolemia, unspecified: Secondary | ICD-10-CM | POA: Diagnosis not present

## 2022-01-29 DIAGNOSIS — E038 Other specified hypothyroidism: Secondary | ICD-10-CM | POA: Diagnosis not present

## 2022-02-01 DIAGNOSIS — M50122 Cervical disc disorder at C5-C6 level with radiculopathy: Secondary | ICD-10-CM | POA: Diagnosis not present

## 2022-02-01 DIAGNOSIS — M2560 Stiffness of unspecified joint, not elsewhere classified: Secondary | ICD-10-CM | POA: Diagnosis not present

## 2022-02-01 DIAGNOSIS — M25512 Pain in left shoulder: Secondary | ICD-10-CM | POA: Diagnosis not present

## 2022-02-28 DIAGNOSIS — M50122 Cervical disc disorder at C5-C6 level with radiculopathy: Secondary | ICD-10-CM | POA: Diagnosis not present

## 2022-02-28 DIAGNOSIS — M25512 Pain in left shoulder: Secondary | ICD-10-CM | POA: Diagnosis not present

## 2022-02-28 DIAGNOSIS — M2560 Stiffness of unspecified joint, not elsewhere classified: Secondary | ICD-10-CM | POA: Diagnosis not present

## 2022-04-02 DIAGNOSIS — E039 Hypothyroidism, unspecified: Secondary | ICD-10-CM | POA: Diagnosis not present

## 2022-06-04 DIAGNOSIS — E039 Hypothyroidism, unspecified: Secondary | ICD-10-CM | POA: Diagnosis not present

## 2022-06-26 DIAGNOSIS — R5383 Other fatigue: Secondary | ICD-10-CM | POA: Diagnosis not present

## 2022-06-26 DIAGNOSIS — R519 Headache, unspecified: Secondary | ICD-10-CM | POA: Diagnosis not present

## 2022-06-26 DIAGNOSIS — M25649 Stiffness of unspecified hand, not elsewhere classified: Secondary | ICD-10-CM | POA: Diagnosis not present

## 2022-07-04 DIAGNOSIS — E038 Other specified hypothyroidism: Secondary | ICD-10-CM | POA: Diagnosis not present

## 2022-07-10 DIAGNOSIS — D709 Neutropenia, unspecified: Secondary | ICD-10-CM | POA: Diagnosis not present

## 2022-07-10 DIAGNOSIS — R519 Headache, unspecified: Secondary | ICD-10-CM | POA: Diagnosis not present

## 2022-07-10 DIAGNOSIS — R5383 Other fatigue: Secondary | ICD-10-CM | POA: Diagnosis not present

## 2022-08-06 DIAGNOSIS — R051 Acute cough: Secondary | ICD-10-CM | POA: Diagnosis not present

## 2022-08-06 DIAGNOSIS — R062 Wheezing: Secondary | ICD-10-CM | POA: Diagnosis not present

## 2022-08-23 DIAGNOSIS — M25552 Pain in left hip: Secondary | ICD-10-CM | POA: Diagnosis not present

## 2022-08-23 DIAGNOSIS — M791 Myalgia, unspecified site: Secondary | ICD-10-CM | POA: Diagnosis not present

## 2022-09-03 DIAGNOSIS — U071 COVID-19: Secondary | ICD-10-CM | POA: Diagnosis not present

## 2022-09-03 DIAGNOSIS — J029 Acute pharyngitis, unspecified: Secondary | ICD-10-CM | POA: Diagnosis not present

## 2022-09-13 DIAGNOSIS — Z1231 Encounter for screening mammogram for malignant neoplasm of breast: Secondary | ICD-10-CM | POA: Diagnosis not present

## 2022-09-25 DIAGNOSIS — E7800 Pure hypercholesterolemia, unspecified: Secondary | ICD-10-CM | POA: Diagnosis not present

## 2022-09-25 DIAGNOSIS — E038 Other specified hypothyroidism: Secondary | ICD-10-CM | POA: Diagnosis not present

## 2022-10-01 DIAGNOSIS — Z0000 Encounter for general adult medical examination without abnormal findings: Secondary | ICD-10-CM | POA: Diagnosis not present

## 2022-10-11 DIAGNOSIS — N644 Mastodynia: Secondary | ICD-10-CM | POA: Diagnosis not present

## 2022-11-20 DIAGNOSIS — L814 Other melanin hyperpigmentation: Secondary | ICD-10-CM | POA: Diagnosis not present

## 2022-11-20 DIAGNOSIS — D225 Melanocytic nevi of trunk: Secondary | ICD-10-CM | POA: Diagnosis not present

## 2022-11-20 DIAGNOSIS — L821 Other seborrheic keratosis: Secondary | ICD-10-CM | POA: Diagnosis not present

## 2022-11-20 DIAGNOSIS — Z86018 Personal history of other benign neoplasm: Secondary | ICD-10-CM | POA: Diagnosis not present

## 2022-12-31 DIAGNOSIS — H0011 Chalazion right upper eyelid: Secondary | ICD-10-CM | POA: Diagnosis not present

## 2023-01-03 DIAGNOSIS — R399 Unspecified symptoms and signs involving the genitourinary system: Secondary | ICD-10-CM | POA: Diagnosis not present

## 2023-01-16 DIAGNOSIS — H0011 Chalazion right upper eyelid: Secondary | ICD-10-CM | POA: Diagnosis not present

## 2023-01-18 DIAGNOSIS — Z113 Encounter for screening for infections with a predominantly sexual mode of transmission: Secondary | ICD-10-CM | POA: Diagnosis not present

## 2023-01-18 DIAGNOSIS — N898 Other specified noninflammatory disorders of vagina: Secondary | ICD-10-CM | POA: Diagnosis not present

## 2023-01-18 DIAGNOSIS — N390 Urinary tract infection, site not specified: Secondary | ICD-10-CM | POA: Diagnosis not present

## 2023-01-22 DIAGNOSIS — Z6829 Body mass index (BMI) 29.0-29.9, adult: Secondary | ICD-10-CM | POA: Diagnosis not present

## 2023-01-22 DIAGNOSIS — Z1272 Encounter for screening for malignant neoplasm of vagina: Secondary | ICD-10-CM | POA: Diagnosis not present

## 2023-01-22 DIAGNOSIS — Z1151 Encounter for screening for human papillomavirus (HPV): Secondary | ICD-10-CM | POA: Diagnosis not present

## 2023-01-22 DIAGNOSIS — Z01419 Encounter for gynecological examination (general) (routine) without abnormal findings: Secondary | ICD-10-CM | POA: Diagnosis not present

## 2023-04-23 DIAGNOSIS — J0110 Acute frontal sinusitis, unspecified: Secondary | ICD-10-CM | POA: Diagnosis not present

## 2023-05-03 ENCOUNTER — Ambulatory Visit
Admission: RE | Admit: 2023-05-03 | Discharge: 2023-05-03 | Disposition: A | Discharge status: Home / Self Care | Payer: BLUE CROSS/BLUE SHIELD | Source: Ambulatory Visit | Attending: Internal Medicine | Admitting: Internal Medicine

## 2023-05-03 ENCOUNTER — Other Ambulatory Visit: Payer: Self-pay | Admitting: Internal Medicine

## 2023-05-03 DIAGNOSIS — J329 Chronic sinusitis, unspecified: Secondary | ICD-10-CM
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
## 2023-05-09 ENCOUNTER — Institutional Professional Consult (permissible substitution) (INDEPENDENT_AMBULATORY_CARE_PROVIDER_SITE_OTHER): Payer: BLUE CROSS/BLUE SHIELD | Admitting: Otolaryngology

## 2023-05-09 ENCOUNTER — Encounter (INDEPENDENT_AMBULATORY_CARE_PROVIDER_SITE_OTHER): Payer: Self-pay | Admitting: Otolaryngology

## 2023-05-09 VITALS — BP 139/88 | HR 67 | Ht 69.0 in | Wt 189.4 lb

## 2023-05-09 DIAGNOSIS — F458 Other somatoform disorders: Secondary | ICD-10-CM

## 2023-05-09 DIAGNOSIS — J3089 Other allergic rhinitis: Secondary | ICD-10-CM | POA: Diagnosis not present

## 2023-05-09 DIAGNOSIS — J343 Hypertrophy of nasal turbinates: Secondary | ICD-10-CM | POA: Diagnosis not present

## 2023-05-09 DIAGNOSIS — R0981 Nasal congestion: Secondary | ICD-10-CM

## 2023-05-09 DIAGNOSIS — J329 Chronic sinusitis, unspecified: Secondary | ICD-10-CM | POA: Diagnosis not present

## 2023-05-09 DIAGNOSIS — J342 Deviated nasal septum: Secondary | ICD-10-CM | POA: Diagnosis not present

## 2023-05-09 DIAGNOSIS — R519 Headache, unspecified: Secondary | ICD-10-CM

## 2023-05-09 NOTE — Patient Instructions (Addendum)
-   schedule CT sinuses  - start nasal saline rinses  - continue Zyrtec and Flonase  - return in 3 weeks

## 2023-05-29 ENCOUNTER — Telehealth: Payer: Self-pay

## 2023-05-29 DIAGNOSIS — M545 Low back pain, unspecified: Secondary | ICD-10-CM | POA: Diagnosis not present

## 2023-05-29 NOTE — Telephone Encounter (Signed)
Dawn from [employer] called stating that there is an CT authorization approved for [facility] in patient's chart but not for [facility]. She stated if ENT wants pt to complete their order, the authorization will need to be changed in the [employer] portal before her appt. If provider wants [facility] to complete it then the ENT office will need to call Pre-Service dept and cancel the appt.   Call back # [phone] ext [phone]

## 2023-05-29 NOTE — Telephone Encounter (Signed)
Jennifer Garza can you help with this?

## 2023-05-29 NOTE — Telephone Encounter (Signed)
Called patient, advised her CT appt is scheduled on 8/16. Patient indicated she had canx the original appointment due to high co-pay. Called Pre-Service, spoke with Ethelene Browns and advised the situation and to cancel CT.

## 2023-05-29 NOTE — Telephone Encounter (Signed)
Called [facility] and requested to change place of service from [facility] to [facility].  All information remains the same.  Called Jennifer Garza with Pre-Service, LMVM that the authorization has been corrected for location of CT and all information remains the same.

## 2023-05-30 ENCOUNTER — Ambulatory Visit (HOSPITAL_COMMUNITY): Payer: BC Managed Care – PPO

## 2023-05-31 ENCOUNTER — Ambulatory Visit (HOSPITAL_COMMUNITY): Payer: BC Managed Care – PPO

## 2023-06-03 ENCOUNTER — Telehealth: Payer: Self-pay

## 2023-06-03 NOTE — Telephone Encounter (Signed)
Patient agrees to try the medication for 3 months and come back in Nov

## 2023-06-03 NOTE — Telephone Encounter (Signed)
-----   Message from Kelle Darting Hedges sent at 05/30/2023  8:40 AM EDT ----- Regarding: RE: CT SCan You can let her know that without the CT we are somewhat limited on being able to diagnosis and exclude causes of her symptoms. If she would like she can continue on the prescribed medications for 3 months and return at that time to see if the symptoms have improved. ----- Message ----- From: Larey Dresser, CMA Sent: 05/29/2023   4:35 PM EDT To: Eyvonne Mechanic, PA-C Subject: CT SCan                                        Patient cancelled CT scan due to high co-pay. Please ask Dr. Irene Pap if she would like for her to come back anyway.  Her f/u appointment is on 8/23 10:00 with her.

## 2023-06-04 DIAGNOSIS — R102 Pelvic and perineal pain: Secondary | ICD-10-CM | POA: Diagnosis not present

## 2023-06-04 DIAGNOSIS — M25559 Pain in unspecified hip: Secondary | ICD-10-CM | POA: Diagnosis not present

## 2023-06-04 DIAGNOSIS — M545 Low back pain, unspecified: Secondary | ICD-10-CM | POA: Diagnosis not present

## 2023-06-07 ENCOUNTER — Ambulatory Visit (INDEPENDENT_AMBULATORY_CARE_PROVIDER_SITE_OTHER): Payer: BC Managed Care – PPO | Admitting: Otolaryngology

## 2023-06-20 DIAGNOSIS — M545 Low back pain, unspecified: Secondary | ICD-10-CM | POA: Diagnosis not present

## 2023-06-20 DIAGNOSIS — R102 Pelvic and perineal pain: Secondary | ICD-10-CM | POA: Diagnosis not present

## 2023-06-20 DIAGNOSIS — M25559 Pain in unspecified hip: Secondary | ICD-10-CM | POA: Diagnosis not present

## 2023-06-24 ENCOUNTER — Telehealth: Payer: Self-pay

## 2023-06-24 ENCOUNTER — Telehealth: Payer: Self-pay | Admitting: Otolaryngology

## 2023-06-24 NOTE — Telephone Encounter (Signed)
Called [facility], spoke with Fina C to change location of imaging to [facility]. Same authorization# good from 06/24/2023-07/23/23

## 2023-06-24 NOTE — Telephone Encounter (Signed)
Pt needs approved auth

## 2023-06-24 NOTE — Telephone Encounter (Signed)
Pt needs auth approved

## 2023-06-25 ENCOUNTER — Telehealth: Payer: Self-pay

## 2023-06-25 NOTE — Telephone Encounter (Signed)
Jennifer Garza from [facility] called and requested a new prior auth be submitted with their info on it for the CT Brain order. The one that's in the system is for [facility] and it expired last month. Pt has an upcoming appt this Wednesday and if they don't have a prior auth, they will will cancel the appointment.

## 2023-06-26 ENCOUNTER — Ambulatory Visit
Admission: RE | Admit: 2023-06-26 | Discharge: 2023-06-26 | Disposition: A | Discharge status: Home / Self Care | Payer: BC Managed Care – PPO | Source: Ambulatory Visit | Attending: Otolaryngology

## 2023-06-26 DIAGNOSIS — J329 Chronic sinusitis, unspecified: Secondary | ICD-10-CM | POA: Diagnosis not present

## 2023-06-26 DIAGNOSIS — R519 Headache, unspecified: Secondary | ICD-10-CM | POA: Diagnosis not present

## 2023-06-27 DIAGNOSIS — M25559 Pain in unspecified hip: Secondary | ICD-10-CM | POA: Diagnosis not present

## 2023-06-27 DIAGNOSIS — M545 Low back pain, unspecified: Secondary | ICD-10-CM | POA: Diagnosis not present

## 2023-06-27 DIAGNOSIS — R102 Pelvic and perineal pain: Secondary | ICD-10-CM | POA: Diagnosis not present

## 2023-07-06 DIAGNOSIS — M545 Low back pain, unspecified: Secondary | ICD-10-CM | POA: Diagnosis not present

## 2023-07-06 DIAGNOSIS — M25559 Pain in unspecified hip: Secondary | ICD-10-CM | POA: Diagnosis not present

## 2023-07-06 DIAGNOSIS — R102 Pelvic and perineal pain: Secondary | ICD-10-CM | POA: Diagnosis not present

## 2023-07-11 DIAGNOSIS — R102 Pelvic and perineal pain: Secondary | ICD-10-CM | POA: Diagnosis not present

## 2023-07-11 DIAGNOSIS — M545 Low back pain, unspecified: Secondary | ICD-10-CM | POA: Diagnosis not present

## 2023-07-11 DIAGNOSIS — M25559 Pain in unspecified hip: Secondary | ICD-10-CM | POA: Diagnosis not present

## 2023-07-18 DIAGNOSIS — R102 Pelvic and perineal pain: Secondary | ICD-10-CM | POA: Diagnosis not present

## 2023-07-18 DIAGNOSIS — M25559 Pain in unspecified hip: Secondary | ICD-10-CM | POA: Diagnosis not present

## 2023-07-18 DIAGNOSIS — M545 Low back pain, unspecified: Secondary | ICD-10-CM | POA: Diagnosis not present

## 2023-07-22 DIAGNOSIS — J343 Hypertrophy of nasal turbinates: Secondary | ICD-10-CM | POA: Diagnosis not present

## 2023-07-22 DIAGNOSIS — J309 Allergic rhinitis, unspecified: Secondary | ICD-10-CM | POA: Diagnosis not present

## 2023-07-22 DIAGNOSIS — R519 Headache, unspecified: Secondary | ICD-10-CM | POA: Diagnosis not present

## 2023-07-22 DIAGNOSIS — J342 Deviated nasal septum: Secondary | ICD-10-CM | POA: Diagnosis not present

## 2023-07-25 ENCOUNTER — Encounter: Payer: Self-pay | Admitting: Podiatry

## 2023-07-25 ENCOUNTER — Ambulatory Visit: Payer: BC Managed Care – PPO | Admitting: Podiatry

## 2023-07-25 DIAGNOSIS — M7752 Other enthesopathy of left foot: Secondary | ICD-10-CM

## 2023-07-25 MED ORDER — TRIAMCINOLONE ACETONIDE 10 MG/ML IJ SUSP
10.0000 mg | Freq: Once | INTRAMUSCULAR | Status: AC
  Administered 2023-07-25: 10 mg via INTRA_ARTICULAR

## 2023-07-26 NOTE — Progress Notes (Signed)
Subjective:   Patient ID: Jennifer Garza, female   DOB: 62 y.o.   MRN: [id number]   HPI Patient presents stating the third toe left has started to bother her over the last few weeks and has been painful making it hard to wear shoe gear comfortably.  Patient states that it was very tender and slightly better but still sore.  Not been seen for years does not smoke likes to be active   Review of Systems  All other systems reviewed and are negative.       Objective:  Physical Exam Vitals and nursing note reviewed.  Constitutional:      Appearance: She is well-developed.  Pulmonary:     Effort: Pulmonary effort is normal.  Musculoskeletal:        General: Normal range of motion.  Skin:    General: Skin is warm.  Neurological:     Mental Status: She is alert.     Neurovascular status intact muscle strength adequate range of motion adequate patient found to have inflammation with fluid buildup of the inner phalangeal joint digit 3 left pressing against the second toe with quite a bit of pain at the joint surface     Assessment:  Inflammatory capsulitis third digit left interphalange joint medial side     Plan:  H&P reviewed she had had this and another toe 4 years ago and at this point I did sterile prep injected the interphalangeal joint 2 mg dexamethasone Kenalog 5 mg Xylocaine applied cushioning to the toe reappoint as needed

## 2023-08-22 ENCOUNTER — Ambulatory Visit (INDEPENDENT_AMBULATORY_CARE_PROVIDER_SITE_OTHER): Payer: BC Managed Care – PPO | Admitting: Otolaryngology

## 2023-08-30 ENCOUNTER — Ambulatory Visit: Payer: BC Managed Care – PPO | Admitting: Internal Medicine

## 2023-08-30 ENCOUNTER — Other Ambulatory Visit: Payer: Self-pay

## 2023-08-30 ENCOUNTER — Encounter: Payer: Self-pay | Admitting: Internal Medicine

## 2023-08-30 VITALS — BP 126/86 | HR 71 | Temp 97.6°F | Resp 14 | Ht 67.5 in | Wt 193.2 lb

## 2023-08-30 DIAGNOSIS — Z882 Allergy status to sulfonamides status: Secondary | ICD-10-CM

## 2023-08-30 DIAGNOSIS — T63481A Toxic effect of venom of other arthropod, accidental (unintentional), initial encounter: Secondary | ICD-10-CM

## 2023-08-30 DIAGNOSIS — J328 Other chronic sinusitis: Secondary | ICD-10-CM

## 2023-08-30 DIAGNOSIS — Z880 Allergy status to penicillin: Secondary | ICD-10-CM

## 2023-08-30 DIAGNOSIS — T63481D Toxic effect of venom of other arthropod, accidental (unintentional), subsequent encounter: Secondary | ICD-10-CM

## 2023-08-30 MED ORDER — MONTELUKAST SODIUM 10 MG PO TABS: 10.0000 mg | ORAL_TABLET | Freq: Every day | ORAL | 1 refills | Status: AC

## 2023-08-30 MED ORDER — IPRATROPIUM BROMIDE 0.06 % NA SOLN: 2.0000 | Freq: Four times a day (QID) | NASAL | 12 refills | Status: AC

## 2023-08-30 NOTE — Progress Notes (Unsigned)
NEW PATIENT Date of Service/Encounter:  09/02/23 Referring provider: Laren Boom, DO Primary care provider: Thana Ates, MD  Subjective:  Jennifer Garza is a 62 y.o. female  presenting today for evaluation of chronic rhinitis and sinusitis  History obtained from: chart review and patient.   Discussed the use of AI scribe software for clinical note transcription with the patient, who gave verbal consent to proceed.  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 They describe persistent pain in the right maxillary sinus area, radiating to the right ear, which is present approximately 50% of the time. The patient has been on multiple courses of antibiotics and steroids for these symptoms.  The patient also reports a lifelong history of a runny nose, which they previously considered normal. They suspect dust may exacerbate this symptom, but do not notice a worsening of symptoms with pollen or exposure to their three dogs. They have tried Flonase and a steroid pill without noticeable improvement. Other allergy medications, such as Claritin, Allegra, and Zyrtec, reportedly cause excessive tiredness and do not seem to provide relief.  The patient has a known allergy to penicillin and sulfa drugs. The reaction to penicillin is unknown as it was identified in childhood, while sulfa drugs reportedly cause vomiting and hives. They also recall a significant reaction to a bee sting, which resulted in prolonged swelling lasting several weeks.  The patient has been referred to an ENT specialist due to the persistent sinusitis. An examination revealed significant inflammation and a deviated septum, likely due to previous accidents. The patient is considering surgery, but the procedure is reportedly complicated due to the  severity of the septum deviation.   They wanted to evaluate for allergic causes due to complexity of the surgery.           Other allergy screening: Asthma: no Rhino conjunctivitis: yes Food allergy: no Medication allergy: yes; PCN - as a child, unknown history, Sulfa - vomiting, hives Hymenoptera allergy:  LRR with honeybee,  Urticaria: no Eczema:no History of recurrent infections suggestive of immunodeficency: no Vaccinations are up to date.   Past Medical History: History reviewed. No pertinent past medical history. Medication List:  Current Outpatient Medications  Medication Sig Dispense Refill   ipratropium (ATROVENT) 0.06 % nasal spray Place 2 sprays into both nostrils 4 (four) times daily. 15 mL 12   levothyroxine (SYNTHROID) 50 MCG tablet TAKE 1 TABLET BY MOUTH IN THE MORNING ON AN EMPTY STOMACH EVERY OTHER DAY ALTERNATING WITH     levothyroxine (SYNTHROID) 75 MCG tablet TAKE 1 TABLET BY MOUTH IN THE MORINING ON AN EMPTY STOMACH EVERY OTHER DAY ALTERNATING WITH TABLETS     montelukast (SINGULAIR) 10 MG tablet Take 1 tablet (10 mg total) by mouth at bedtime. 90 tablet 1   Multiple Vitamin (MULTI VITAMIN) TABS 1 tablet Orally Once a day for 30 day(s)     rosuvastatin (CRESTOR) 10 MG tablet Take 10 mg by mouth at bedtime.     sertraline (ZOLOFT) 50 MG tablet Take 1 tablet by mouth daily.     No current facility-administered medications for this visit.   Known Allergies:  Allergies  Allergen Reactions   Sulfa Antibiotics Other (See Comments)    Unknown, Reaction as child   Diclofenac Sodium Diarrhea and Nausea Only    Passed  out Any anti inflamatory Passed out Any anti inflamatory    Nickel Rash   Penicillins Other (See Comments), Rash and Swelling    Unknown, Reaction as child    Sulfamethoxazole Rash and Swelling   Past Surgical History: Past Surgical History:  Procedure Laterality Date   CESAREAN SECTION     Family History: Family History   Problem Relation Age of Onset   Prostate cancer Father    Rosacea Brother    Diabetes Son    Social History: [place] lives single-family home is [address] years old.  No water damage in the house.  With throughout.  Tooth thermal heating and cooling.  Dogs with access to bedroom.  No roaches in the house and bed is to be off the floor.  No dust mite precautions.  Not exposed to molds or dust.  No tobacco exposure.  Works as a Runner, broadcasting/film/video the past 2 years..   ROS:  All other systems negative except as noted per HPI.  Objective:  Blood pressure 126/86, pulse 71, temperature 97.6 F (36.4 C), temperature source Temporal, resp. rate 14, height 5' 7.5" (1.715 m), weight 193 lb 3.2 oz (87.6 kg), SpO2 95%. Body mass index is 29.81 kg/m. Physical Exam:  General Appearance:  Alert, cooperative, no distress, appears stated age  Head:  Normocephalic, without obvious abnormality, atraumatic  Eyes:  Conjunctiva clear, EOM's intact  Ears EACs normal bilaterally  Nose: Nares normal,  septum deviated to left, erythematous nasal mucosa with clear rhinnrohea  and no visible anterior polyps  Throat: Lips, tongue normal; teeth and gums normal, normal posterior oropharynx  Neck: Supple, symmetrical  Lungs:   clear to auscultation bilaterally, Respirations unlabored, no coughing  Heart:  regular rate and rhythm and no murmur, Appears well perfused  Extremities: No edema  Skin: Skin color, texture, turgor normal and no rashes or lesions on visualized portions of skin  Neurologic: No gross deficits   Diagnostics: None done   Labs:  Lab Orders  No laboratory test(s) ordered today     Assessment and Plan  Assessment and Plan    Chronic Sinusitis Recurrent sinusitis since COVID-19 infection, with persistent right maxillary sinus pain. Nasal endoscopy revealed significant inflammation and septal deviation. -Schedule for allergy testing to rule out allergic component. -Start montelukast 10 mg at night.   Monitor for nightmares - Start Atrovent 1 spray per nostril up to 3 times a day for drainage -Discuss potential surgical options with ENT, including antrostomy.  Drug Allergies (Penicillin, Sulfa) History of penicillin and sulfa drug allergies, with unclear reaction to penicillin and vomiting/hives with sulfa drugs. -Plan for penicillin drug challenge to reevaluate penicillin allergy, given potential need for targeted antibiotics for sinusitis management.  -Prescription has been placed for amoxicillin you will need to bring this to the appointment and be off all antihistamines for 3 days prior -Consider reevaluation of sulfa allergy in the future, but prioritize penicillin due to lower risk and higher potential benefit.   Large Local Reactions to Insect Stings History of large local reactions to bee and horsefly stings. - your risk of a severe systemic reaction remains low but is slightly increased from the general population and we do not need to do anything different at this time - based on shared decision making, we have prescribed an Epipen to be used if stung and develop ANY ADDITIONAL symptoms besides skin findings (example-nausea, vomiting, diarrhea, lightheadedness, trouble breathing, etc). - if you are stung again and have other symptoms besides hives  or localized swelling, please let us know as that would change our management  Follow up: for allergy skin test (1-68), hold all antihistamines for 3 days prior          This note in its entirety was forwarded to the Provider who requested this consultation.  Other:     Thank you for your kind referral. I appreciate the opportunity to take part in Charlayne's care. Please do not hesitate to contact me with questions.  Sincerely,  Thank you so much for letting me partake in your care today.  Don't hesitate to reach out if you have any additional concerns!  Ferol Luz, MD  [facility] [place], [place]

## 2023-08-30 NOTE — Patient Instructions (Signed)
Chronic Sinusitis Recurrent sinusitis since COVID-19 infection, with persistent right maxillary sinus pain. Nasal endoscopy revealed significant inflammation and septal deviation. -Schedule for allergy testing to rule out allergic component. -Start montelukast 10 mg at night.  Monitor for nightmares - Start Atrovent 1 spray per nostril up to 3 times a day for drainage -Discuss potential surgical options with ENT, including antrostomy.  Drug Allergies (Penicillin, Sulfa) History of penicillin and sulfa drug allergies, with unclear reaction to penicillin and vomiting/hives with sulfa drugs. -Plan for penicillin drug challenge to reevaluate penicillin allergy, given potential need for targeted antibiotics for sinusitis management.  -Prescription has been placed for amoxicillin you will need to bring this to the appointment and be off all antihistamines for 3 days prior -Consider reevaluation of sulfa allergy in the future, but prioritize penicillin due to lower risk and higher potential benefit.   Large Local Reactions to Insect Stings History of large local reactions to bee and horsefly stings. - your risk of a severe systemic reaction remains low but is slightly increased from the general population and we do not need to do anything different at this time - based on shared decision making, we have prescribed an Epipen to be used if stung and develop ANY ADDITIONAL symptoms besides skin findings (example-nausea, vomiting, diarrhea, lightheadedness, trouble breathing, etc). - if you are stung again and have other symptoms besides hives or localized swelling, please let us know as that would change our management  Follow up: for allergy skin test (1-68), hold all antihistamines for 3 days prior   Thank you so much for letting me partake in your care today.  Don't hesitate to reach out if you have any additional concerns!  Ferol Luz, MD  [facility] [place], [place]

## 2023-09-09 DIAGNOSIS — E038 Other specified hypothyroidism: Secondary | ICD-10-CM | POA: Diagnosis not present

## 2023-09-09 DIAGNOSIS — E559 Vitamin D deficiency, unspecified: Secondary | ICD-10-CM | POA: Diagnosis not present

## 2023-09-09 DIAGNOSIS — M25642 Stiffness of left hand, not elsewhere classified: Secondary | ICD-10-CM | POA: Diagnosis not present

## 2023-09-09 DIAGNOSIS — J029 Acute pharyngitis, unspecified: Secondary | ICD-10-CM | POA: Diagnosis not present

## 2023-09-09 DIAGNOSIS — M25641 Stiffness of right hand, not elsewhere classified: Secondary | ICD-10-CM | POA: Diagnosis not present

## 2023-09-09 DIAGNOSIS — Z5181 Encounter for therapeutic drug level monitoring: Secondary | ICD-10-CM | POA: Diagnosis not present

## 2023-09-09 DIAGNOSIS — R0982 Postnasal drip: Secondary | ICD-10-CM | POA: Diagnosis not present

## 2023-09-09 DIAGNOSIS — Z03818 Encounter for observation for suspected exposure to other biological agents ruled out: Secondary | ICD-10-CM | POA: Diagnosis not present

## 2023-09-09 DIAGNOSIS — E7800 Pure hypercholesterolemia, unspecified: Secondary | ICD-10-CM | POA: Diagnosis not present

## 2023-09-11 DIAGNOSIS — J029 Acute pharyngitis, unspecified: Secondary | ICD-10-CM | POA: Diagnosis not present

## 2023-09-19 DIAGNOSIS — Z1231 Encounter for screening mammogram for malignant neoplasm of breast: Secondary | ICD-10-CM | POA: Diagnosis not present

## 2023-10-03 DIAGNOSIS — Z0000 Encounter for general adult medical examination without abnormal findings: Secondary | ICD-10-CM | POA: Diagnosis not present

## 2023-10-04 ENCOUNTER — Ambulatory Visit: Payer: BC Managed Care – PPO | Admitting: Internal Medicine

## 2023-10-04 DIAGNOSIS — T63481A Toxic effect of venom of other arthropod, accidental (unintentional), initial encounter: Secondary | ICD-10-CM

## 2023-10-04 DIAGNOSIS — Z882 Allergy status to sulfonamides status: Secondary | ICD-10-CM

## 2023-10-04 DIAGNOSIS — J328 Other chronic sinusitis: Secondary | ICD-10-CM | POA: Diagnosis not present

## 2023-10-04 DIAGNOSIS — Z880 Allergy status to penicillin: Secondary | ICD-10-CM

## 2023-10-04 NOTE — Patient Instructions (Addendum)
Chronic Sinusitis Recurrent sinusitis since COVID-19 infection, with persistent right maxillary sinus pain. Nasal endoscopy revealed significant inflammation and septal deviation. - Allergy testing (10/04/23): Borderline to mold mix 4 otherwise negative - Continue  montelukast 10 mg at night.  Monitor for nightmares - Continue  Atrovent 1 spray per nostril up to 3 times a day for drainage -Discuss potential surgical options with ENT, including antrostomy.  Drug Allergies (Penicillin, Sulfa) History of penicillin and sulfa drug allergies, with unclear reaction to penicillin and vomiting/hives with sulfa drugs. -Plan for penicillin drug challenge to reevaluate penicillin allergy, given potential need for targeted antibiotics for sinusitis management.  -Prescription has been placed for amoxicillin you will need to bring this to the appointment and be off all antihistamines for 3 days prior -Consider reevaluation of sulfa allergy in the future, but prioritize penicillin due to lower risk and higher potential benefit.   Large Local Reactions to Insect Stings History of large local reactions to bee and horsefly stings. - your risk of a severe systemic reaction remains low but is slightly increased from the general population and we do not need to do anything different at this time - based on shared decision making, we have prescribed an Epipen to be used if stung and develop ANY ADDITIONAL symptoms besides skin findings (example-nausea, vomiting, diarrhea, lightheadedness, trouble breathing, etc). - if you are stung again and have other symptoms besides hives or localized swelling, please let us know as that would change our management  Follow up: for penicillin challenge  Follow-up: With ENT for surgical intervention  Thank you so much for letting me partake in your care today.  Don't hesitate to reach out if you have any additional concerns!  Ferol Luz, MD  [facility],  [place]  Control of Mold Allergen   Mold and fungi can grow on a variety of surfaces provided certain temperature and moisture conditions exist.  Outdoor molds grow on plants, decaying vegetation and soil.  The major outdoor mold, Alternaria and Cladosporium, are found in very high numbers during hot and dry conditions.  Generally, a late Summer - Fall peak is seen for common outdoor fungal spores.  Rain will temporarily lower outdoor mold spore count, but counts rise rapidly when the rainy period ends.  The most important indoor molds are Aspergillus and Penicillium.  Dark, humid and poorly ventilated basements are ideal sites for mold growth.  The next most common sites of mold growth are the bathroom and the kitchen.  Outdoor (Seasonal) Mold Control  Use air conditioning and keep windows closed Avoid exposure to decaying vegetation. Avoid leaf raking. Avoid grain handling. Consider wearing a face mask if working in moldy areas.    Indoor (Perennial) Mold Control   Maintain humidity below 50%. Clean washable surfaces with 5% bleach solution. Remove sources e.g. contaminated carpets.

## 2023-10-04 NOTE — Progress Notes (Unsigned)
Date of Service/Encounter:  10/06/23  Allergy testing appointment   Initial visit on 08/30/23, seen for chronic sinusitis .  Please see that note for additional details.  Today reports for allergy diagnostic testing:    DIAGNOSTICS:  Skin Testing: Environmental allergy panel and select foods. Adequate positive and negative controls Results discussed with patient/family.  Airborne Adult Perc - 10/04/23 1402     Time Antigen Placed 1402    Allergen Manufacturer Waynette Buttery    Location Back    Number of Test 55    1. Control-Buffer 50% Glycerol Negative    2. Control-Histamine 4+    3. Bahia Negative    4. French Southern Territories Negative    5. Johnson Negative    6. Kentucky Blue Negative    7. Meadow Fescue Negative    8. Perennial Rye Negative    9. Timothy Negative    10. Ragweed Mix Negative    11. Cocklebur Negative    12. Plantain,  English Negative    13. Baccharis Negative    14. Dog Fennel Negative    15. Russian Thistle Negative    16. Lamb's Quarters Negative    17. Sheep Sorrell Negative    18. Rough Pigweed Negative    19. Marsh Elder, Rough Negative    20. Mugwort, Common Negative    21. Box, Elder Negative    22. Cedar, red Negative    23. Sweet Gum Negative    24. Pecan Pollen Negative    25. Pine Mix Negative    26. Walnut, Black Pollen Negative    27. Red Mulberry Negative    28. Ash Mix Negative    29. Birch Mix Negative    30. Beech American Negative    31. Cottonwood, Guinea-Bissau Negative    32. Hickory, White Negative    33. Maple Mix Negative    34. Oak, Guinea-[place]issau Mix Negative    35. Sycamore Eastern Negative    36. Alternaria Alternata Negative    37. Cladosporium Herbarum Negative    38. Aspergillus Mix Negative    39. Penicillium Mix Negative    40. Bipolaris Sorokiniana (Helminthosporium) Negative    41. Drechslera Spicifera (Curvularia) Negative    42. Mucor Plumbeus Negative    43. Fusarium Moniliforme Negative    44. Aureobasidium Pullulans (pullulara)  Negative    45. Rhizopus Oryzae Negative    46. Botrytis Cinera Negative    47. Epicoccum Nigrum Negative    48. Phoma Betae Negative    49. Dust Mite Mix Negative    50. Cat Hair 10,000 BAU/ml Negative    51.  Dog Epithelia Negative    52. Mixed Feathers Negative    53. Horse Epithelia Negative    54. Cockroach, German Negative    55. Tobacco Leaf Negative             13 Food Perc - 10/04/23 1403       Test Information   Time Antigen Placed 1403    Allergen Manufacturer Waynette Buttery    Location Back    Number of allergen test 13      Food   1. Peanut Negative    2. Soybean Negative    3. Wheat Negative    4. Sesame Negative    5. Milk, Cow Negative    6. Casein Negative    7. Egg White, Chicken Negative    8. Shellfish Mix Negative    9. Fish Mix Negative    10.  Cashew Negative    11. Walnut Food Negative    12. Almond Negative    13. Hazelnut Negative             Intradermal - 10/04/23 1434     Time Antigen Placed 1434    Allergen Manufacturer Waynette Buttery    Location Arm    Number of Test 16    Control Negative    Bahia Negative    French Southern Territories Negative    Johnson Negative    7 Grass Negative    Ragweed Mix Negative    Weed Mix Negative    Tree Mix Negative    Mold 1 Negative    Mold 2 Negative    Mold 3 Negative    Mold 4 2+    Mite Mix Negative    Cat Negative    Dog Negative    Cockroach Negative             Allergy testing results were read and interpreted by myself, documented by clinical staff.  Patient provided with copy of allergy testing along with avoidance measures when indicated.   Ferol Luz, MD  [facility]

## 2023-10-07 DIAGNOSIS — J069 Acute upper respiratory infection, unspecified: Secondary | ICD-10-CM | POA: Diagnosis not present

## 2023-10-14 NOTE — Patient Instructions (Addendum)
 Jennifer Garza was able to tolerate the amoxicillin 250 mg/ 5 mL challenge today at the office without adverse signs or symptoms of an allergic reaction. Therefore, she has the same risk of systemic reaction associated with the consumption of penicillin products as the general population.  - Do not give any penicillin products for the next 24 hours. - Monitor for allergic symptoms such as rash, wheezing, diarrhea, swelling, and vomiting for the next 24 hours. If severe symptoms occur, treat with EpiPen  injection and call 911. For less severe symptoms treat with Benadryl 4 teaspoonfuls every 4-6 hours and call the clinic.   We will remove penicillin from your allergy  list  Schedule a follow up appointment in 3-4 months or sooner if needed

## 2023-10-15 ENCOUNTER — Other Ambulatory Visit: Payer: Self-pay

## 2023-10-15 ENCOUNTER — Ambulatory Visit: Payer: BC Managed Care – PPO | Admitting: Family

## 2023-10-15 ENCOUNTER — Encounter: Payer: Self-pay | Admitting: Family

## 2023-10-15 ENCOUNTER — Other Ambulatory Visit (HOSPITAL_COMMUNITY): Payer: Self-pay

## 2023-10-15 VITALS — BP 124/70 | HR 58 | Temp 97.7°F | Resp 16 | Wt 195.3 lb

## 2023-10-15 DIAGNOSIS — T63481A Toxic effect of venom of other arthropod, accidental (unintentional), initial encounter: Secondary | ICD-10-CM

## 2023-10-15 DIAGNOSIS — Z880 Allergy status to penicillin: Secondary | ICD-10-CM

## 2023-10-15 MED ORDER — EPINEPHRINE 0.3 MG/0.3ML IJ SOAJ: 0.3000 mg | INTRAMUSCULAR | 1 refills | Status: AC | PRN

## 2023-10-15 NOTE — Progress Notes (Signed)
 [address] [place] [address] Dept: [phone]  FOLLOW UP NOTE  Patient ID: Jennifer Garza, female    DOB: 07/28/1961  Age: 62 y.o. MRN: [id number] Date of Office Visit: 10/15/2023  Assessment  Chief Complaint: Food/Drug Challenge (Penicillin and amoxicillin)  HPI Jennifer Garza is a 62 year old female who presents today for penicillin skin testing and possible amoxicillin 250 mg per 5 mL challenge.  She was last seen on October 04, 2023 by Dr. Lorin for chronic sinusitis, penicillin allergy , allergy  to sulfa drugs, and local reaction to hymenoptera sting.  She denies any new diagnosis or surgeries since her last office visit.  Her reaction to penicillin is unknown as it was identified in her childhood.  She has been off all antihistamines for the past 3 days and is in good health.  She denies any cardiorespiratory, gastrointestinal, cutaneous symptoms.  She does mention that she has her usual green rhinorrhea, nasal congestion, postnasal drip, hoarseness due to possible postnasal drip, sinus headache, and right sided sinus pressure.  All questions answered and informed consent signed.  She did not ever get the EpiPen  that was recommended at her last office visit for large local reactions to insect stings. Drug Allergies:  Allergies  Allergen Reactions   Sulfa Antibiotics Other (See Comments)    Unknown, Reaction as child   Diclofenac Sodium Diarrhea and Nausea Only    Passed out Any anti inflamatory Passed out Any anti inflamatory    Nickel Rash   Sulfamethoxazole Rash and Swelling    Review of Systems: Negative except as per HPI   Physical Exam: BP 124/70   Pulse (!) 58   Temp 97.7 F (36.5 C) (Temporal)   Resp 16   Wt 195 lb 4.8 oz (88.6 kg)   SpO2 96%   BMI 30.14 kg/m    Physical Exam Constitutional:      Appearance: Normal appearance.  HENT:     Head: Normocephalic and atraumatic.     Comments: Pharynx normal, eyes normal, ears normal, nose: Septum  deviated to the left.  Scabbing noted in left nostril.  No drainage noted    Right Ear: Tympanic membrane, ear canal and external ear normal.     Left Ear: Tympanic membrane, ear canal and external ear normal.     Mouth/Throat:     Mouth: Mucous membranes are moist.     Pharynx: Oropharynx is clear.  Eyes:     Conjunctiva/sclera: Conjunctivae normal.  Cardiovascular:     Rate and Rhythm: Regular rhythm.     Heart sounds: Normal heart sounds.  Pulmonary:     Effort: Pulmonary effort is normal.     Breath sounds: Normal breath sounds.     Comments: Lungs clear to auscultation Musculoskeletal:     Cervical back: Neck supple.  Skin:    General: Skin is warm.     Comments: No rashes or urticarial lesions noted  Neurological:     Mental Status: She is alert and oriented to person, place, and time.  Psychiatric:        Mood and Affect: Mood normal.        Behavior: Behavior normal.        Thought Content: Thought content normal.        Judgment: Judgment normal.     Diagnostics:  Oral Challenge - 10/15/23 1000     Challenge Food/Drug amoxicillin    Food/Drug provided by practice    Time 1020    Dose 1  mL    Time 1054    Dose 9 mL             Penicillin - 10/15/23 0857     Lot # [id number], [id number]    Location Arm    Number of allergen test 8    Time Testing Placed 0857    Control SPT Negative    Histamine SPT 3+    Pre-Pen Puncture Negative    Penicillin-G 5000 u/ml SPT Negative    Time Testing Placed 0916    Control Intradermal Negative    Pre-Pen Intradermal Negative    Penicillin-G 50 u/ml Intradermal Negative    Time Testing Placed 0955    Penicillin-G 5000 u/ml Intradermal Negative             After the 15-minute timer went off for the control intradermal and Pre-Pen intradermal reading she reports that she feels a little woozy.  Both control intradermal and Pre-Pen intradermal were negative.  Physical exam and vitals are stable.  She reports that she is  feeling better with no intervention.   Open graded amoxicillin 250 mg/ 5 mL oral challenge: The patient was able to tolerate the challenge today without adverse signs or symptoms. Vital signs were stable throughout the challenge and observation period. She received multiple doses separated by 30 minutes, each of which was separated by vitals and a brief physical exam. She received the following doses: 1 mL, 9 mL. She was monitored for 60 minutes following the last dose.   The patient had  negative penicillin skin testing today  and was able to tolerate the open graded oral challenge today without adverse signs or symptoms. Therefore, she has the same risk of systemic reaction associated with  penicillin  as the general population.     Assessment and Plan: 1. Penicillin allergy    2. Local reaction to hymenoptera sting     Meds ordered this encounter  Medications   EPINEPHrine  0.3 mg/0.3 mL IJ SOAJ injection    Sig: Inject 0.3 mg into the muscle as needed for anaphylaxis.    Dispense:  2 each    Refill:  1    Patient Instructions  [name] was able to tolerate the amoxicillin 250 mg/ 5 mL challenge today at the office without adverse signs or symptoms of an allergic reaction. Therefore, she has the same risk of systemic reaction associated with the consumption of penicillin products as the general population.  - Do not give any penicillin products for the next 24 hours. - Monitor for allergic symptoms such as rash, wheezing, diarrhea, swelling, and vomiting for the next 24 hours. If severe symptoms occur, treat with EpiPen  injection and call 911. For less severe symptoms treat with Benadryl 4 teaspoonfuls every 4-6 hours and call the clinic.   We will remove penicillin from your allergy  list  Schedule a follow up appointment in 3-4 months or sooner if needed   Return in about 3 months (around 01/13/2024), or if symptoms worsen or fail to improve.    Thank you for the opportunity to care  for this patient.  Please do not hesitate to contact me with questions.  Wanda Craze, FNP [facility] 

## 2023-11-05 DIAGNOSIS — M79643 Pain in unspecified hand: Secondary | ICD-10-CM | POA: Diagnosis not present

## 2023-11-05 DIAGNOSIS — M79672 Pain in left foot: Secondary | ICD-10-CM | POA: Diagnosis not present

## 2023-11-05 DIAGNOSIS — M1990 Unspecified osteoarthritis, unspecified site: Secondary | ICD-10-CM | POA: Diagnosis not present

## 2023-11-05 DIAGNOSIS — M5030 Other cervical disc degeneration, unspecified cervical region: Secondary | ICD-10-CM | POA: Diagnosis not present

## 2023-11-05 DIAGNOSIS — Z1211 Encounter for screening for malignant neoplasm of colon: Secondary | ICD-10-CM | POA: Diagnosis not present

## 2023-11-05 DIAGNOSIS — M79671 Pain in right foot: Secondary | ICD-10-CM | POA: Diagnosis not present

## 2023-11-05 DIAGNOSIS — M79641 Pain in right hand: Secondary | ICD-10-CM | POA: Diagnosis not present

## 2023-11-05 DIAGNOSIS — M25572 Pain in left ankle and joints of left foot: Secondary | ICD-10-CM | POA: Diagnosis not present

## 2023-11-05 DIAGNOSIS — M25571 Pain in right ankle and joints of right foot: Secondary | ICD-10-CM | POA: Diagnosis not present

## 2023-11-05 DIAGNOSIS — M0609 Rheumatoid arthritis without rheumatoid factor, multiple sites: Secondary | ICD-10-CM | POA: Diagnosis not present

## 2023-11-05 DIAGNOSIS — M79642 Pain in left hand: Secondary | ICD-10-CM | POA: Diagnosis not present

## 2023-12-03 DIAGNOSIS — D225 Melanocytic nevi of trunk: Secondary | ICD-10-CM | POA: Diagnosis not present

## 2023-12-03 DIAGNOSIS — L821 Other seborrheic keratosis: Secondary | ICD-10-CM | POA: Diagnosis not present

## 2023-12-03 DIAGNOSIS — L814 Other melanin hyperpigmentation: Secondary | ICD-10-CM | POA: Diagnosis not present

## 2023-12-03 DIAGNOSIS — Z86018 Personal history of other benign neoplasm: Secondary | ICD-10-CM | POA: Diagnosis not present

## 2023-12-12 DIAGNOSIS — M0609 Rheumatoid arthritis without rheumatoid factor, multiple sites: Secondary | ICD-10-CM | POA: Diagnosis not present

## 2023-12-12 DIAGNOSIS — M5030 Other cervical disc degeneration, unspecified cervical region: Secondary | ICD-10-CM | POA: Diagnosis not present

## 2023-12-12 DIAGNOSIS — M1990 Unspecified osteoarthritis, unspecified site: Secondary | ICD-10-CM | POA: Diagnosis not present

## 2023-12-12 DIAGNOSIS — M79643 Pain in unspecified hand: Secondary | ICD-10-CM | POA: Diagnosis not present

## 2024-01-21 DIAGNOSIS — L719 Rosacea, unspecified: Secondary | ICD-10-CM | POA: Diagnosis not present

## 2024-01-21 DIAGNOSIS — L723 Sebaceous cyst: Secondary | ICD-10-CM | POA: Diagnosis not present

## 2024-01-24 DIAGNOSIS — M545 Low back pain, unspecified: Secondary | ICD-10-CM | POA: Diagnosis not present

## 2024-01-24 DIAGNOSIS — M25569 Pain in unspecified knee: Secondary | ICD-10-CM | POA: Diagnosis not present

## 2024-01-24 DIAGNOSIS — M25579 Pain in unspecified ankle and joints of unspecified foot: Secondary | ICD-10-CM | POA: Diagnosis not present

## 2024-01-24 DIAGNOSIS — M25559 Pain in unspecified hip: Secondary | ICD-10-CM | POA: Diagnosis not present

## 2024-02-05 DIAGNOSIS — M25559 Pain in unspecified hip: Secondary | ICD-10-CM | POA: Diagnosis not present

## 2024-02-05 DIAGNOSIS — M25579 Pain in unspecified ankle and joints of unspecified foot: Secondary | ICD-10-CM | POA: Diagnosis not present

## 2024-02-05 DIAGNOSIS — M545 Low back pain, unspecified: Secondary | ICD-10-CM | POA: Diagnosis not present

## 2024-02-05 DIAGNOSIS — M25569 Pain in unspecified knee: Secondary | ICD-10-CM | POA: Diagnosis not present

## 2024-02-19 DIAGNOSIS — M25579 Pain in unspecified ankle and joints of unspecified foot: Secondary | ICD-10-CM | POA: Diagnosis not present

## 2024-02-19 DIAGNOSIS — M545 Low back pain, unspecified: Secondary | ICD-10-CM | POA: Diagnosis not present

## 2024-02-19 DIAGNOSIS — J029 Acute pharyngitis, unspecified: Secondary | ICD-10-CM | POA: Diagnosis not present

## 2024-02-19 DIAGNOSIS — M25559 Pain in unspecified hip: Secondary | ICD-10-CM | POA: Diagnosis not present

## 2024-02-19 DIAGNOSIS — M25569 Pain in unspecified knee: Secondary | ICD-10-CM | POA: Diagnosis not present

## 2024-02-19 DIAGNOSIS — R051 Acute cough: Secondary | ICD-10-CM | POA: Diagnosis not present

## 2024-02-26 DIAGNOSIS — M545 Low back pain, unspecified: Secondary | ICD-10-CM | POA: Diagnosis not present

## 2024-02-26 DIAGNOSIS — M25569 Pain in unspecified knee: Secondary | ICD-10-CM | POA: Diagnosis not present

## 2024-02-26 DIAGNOSIS — M25579 Pain in unspecified ankle and joints of unspecified foot: Secondary | ICD-10-CM | POA: Diagnosis not present

## 2024-02-26 DIAGNOSIS — M25559 Pain in unspecified hip: Secondary | ICD-10-CM | POA: Diagnosis not present

## 2024-03-04 DIAGNOSIS — L720 Epidermal cyst: Secondary | ICD-10-CM | POA: Diagnosis not present

## 2024-03-04 DIAGNOSIS — M25579 Pain in unspecified ankle and joints of unspecified foot: Secondary | ICD-10-CM | POA: Diagnosis not present

## 2024-03-04 DIAGNOSIS — D485 Neoplasm of uncertain behavior of skin: Secondary | ICD-10-CM | POA: Diagnosis not present

## 2024-03-04 DIAGNOSIS — M25559 Pain in unspecified hip: Secondary | ICD-10-CM | POA: Diagnosis not present

## 2024-03-04 DIAGNOSIS — M25569 Pain in unspecified knee: Secondary | ICD-10-CM | POA: Diagnosis not present

## 2024-03-04 DIAGNOSIS — M545 Low back pain, unspecified: Secondary | ICD-10-CM | POA: Diagnosis not present

## 2024-03-11 DIAGNOSIS — M25559 Pain in unspecified hip: Secondary | ICD-10-CM | POA: Diagnosis not present

## 2024-03-11 DIAGNOSIS — M545 Low back pain, unspecified: Secondary | ICD-10-CM | POA: Diagnosis not present

## 2024-03-11 DIAGNOSIS — M25579 Pain in unspecified ankle and joints of unspecified foot: Secondary | ICD-10-CM | POA: Diagnosis not present

## 2024-03-11 DIAGNOSIS — M25569 Pain in unspecified knee: Secondary | ICD-10-CM | POA: Diagnosis not present

## 2024-03-18 DIAGNOSIS — M25569 Pain in unspecified knee: Secondary | ICD-10-CM | POA: Diagnosis not present

## 2024-03-18 DIAGNOSIS — M545 Low back pain, unspecified: Secondary | ICD-10-CM | POA: Diagnosis not present

## 2024-03-18 DIAGNOSIS — M25579 Pain in unspecified ankle and joints of unspecified foot: Secondary | ICD-10-CM | POA: Diagnosis not present

## 2024-03-18 DIAGNOSIS — M25559 Pain in unspecified hip: Secondary | ICD-10-CM | POA: Diagnosis not present

## 2024-03-25 DIAGNOSIS — M25559 Pain in unspecified hip: Secondary | ICD-10-CM | POA: Diagnosis not present

## 2024-03-25 DIAGNOSIS — M25579 Pain in unspecified ankle and joints of unspecified foot: Secondary | ICD-10-CM | POA: Diagnosis not present

## 2024-03-25 DIAGNOSIS — M25569 Pain in unspecified knee: Secondary | ICD-10-CM | POA: Diagnosis not present

## 2024-03-25 DIAGNOSIS — M545 Low back pain, unspecified: Secondary | ICD-10-CM | POA: Diagnosis not present

## 2024-03-30 DIAGNOSIS — E663 Overweight: Secondary | ICD-10-CM | POA: Diagnosis not present

## 2024-03-30 DIAGNOSIS — L309 Dermatitis, unspecified: Secondary | ICD-10-CM | POA: Diagnosis not present

## 2024-04-15 DIAGNOSIS — M545 Low back pain, unspecified: Secondary | ICD-10-CM | POA: Diagnosis not present

## 2024-04-15 DIAGNOSIS — M25569 Pain in unspecified knee: Secondary | ICD-10-CM | POA: Diagnosis not present

## 2024-04-15 DIAGNOSIS — M25559 Pain in unspecified hip: Secondary | ICD-10-CM | POA: Diagnosis not present

## 2024-04-15 DIAGNOSIS — M25579 Pain in unspecified ankle and joints of unspecified foot: Secondary | ICD-10-CM | POA: Diagnosis not present

## 2024-04-16 DIAGNOSIS — Z01419 Encounter for gynecological examination (general) (routine) without abnormal findings: Secondary | ICD-10-CM | POA: Diagnosis not present

## 2024-04-16 DIAGNOSIS — Z6827 Body mass index (BMI) 27.0-27.9, adult: Secondary | ICD-10-CM | POA: Diagnosis not present

## 2024-04-23 DIAGNOSIS — L719 Rosacea, unspecified: Secondary | ICD-10-CM | POA: Diagnosis not present

## 2024-04-28 DIAGNOSIS — H43392 Other vitreous opacities, left eye: Secondary | ICD-10-CM | POA: Diagnosis not present

## 2024-04-29 DIAGNOSIS — R102 Pelvic and perineal pain: Secondary | ICD-10-CM | POA: Diagnosis not present

## 2024-04-29 DIAGNOSIS — M25559 Pain in unspecified hip: Secondary | ICD-10-CM | POA: Diagnosis not present

## 2024-04-29 DIAGNOSIS — M545 Low back pain, unspecified: Secondary | ICD-10-CM | POA: Diagnosis not present

## 2024-05-02 DIAGNOSIS — R300 Dysuria: Secondary | ICD-10-CM | POA: Diagnosis not present

## 2024-05-13 DIAGNOSIS — M25559 Pain in unspecified hip: Secondary | ICD-10-CM | POA: Diagnosis not present

## 2024-05-13 DIAGNOSIS — M25579 Pain in unspecified ankle and joints of unspecified foot: Secondary | ICD-10-CM | POA: Diagnosis not present

## 2024-05-13 DIAGNOSIS — M25569 Pain in unspecified knee: Secondary | ICD-10-CM | POA: Diagnosis not present

## 2024-05-13 DIAGNOSIS — M545 Low back pain, unspecified: Secondary | ICD-10-CM | POA: Diagnosis not present

## 2024-05-20 DIAGNOSIS — M545 Low back pain, unspecified: Secondary | ICD-10-CM | POA: Diagnosis not present

## 2024-05-20 DIAGNOSIS — M25579 Pain in unspecified ankle and joints of unspecified foot: Secondary | ICD-10-CM | POA: Diagnosis not present

## 2024-05-20 DIAGNOSIS — M25569 Pain in unspecified knee: Secondary | ICD-10-CM | POA: Diagnosis not present

## 2024-05-20 DIAGNOSIS — M25559 Pain in unspecified hip: Secondary | ICD-10-CM | POA: Diagnosis not present

## 2024-05-27 DIAGNOSIS — M25559 Pain in unspecified hip: Secondary | ICD-10-CM | POA: Diagnosis not present

## 2024-05-27 DIAGNOSIS — M545 Low back pain, unspecified: Secondary | ICD-10-CM | POA: Diagnosis not present

## 2024-05-27 DIAGNOSIS — M25579 Pain in unspecified ankle and joints of unspecified foot: Secondary | ICD-10-CM | POA: Diagnosis not present

## 2024-05-27 DIAGNOSIS — M25569 Pain in unspecified knee: Secondary | ICD-10-CM | POA: Diagnosis not present

## 2024-06-02 DIAGNOSIS — M1990 Unspecified osteoarthritis, unspecified site: Secondary | ICD-10-CM | POA: Diagnosis not present

## 2024-06-02 DIAGNOSIS — M79671 Pain in right foot: Secondary | ICD-10-CM | POA: Diagnosis not present

## 2024-06-02 DIAGNOSIS — M79643 Pain in unspecified hand: Secondary | ICD-10-CM | POA: Diagnosis not present

## 2024-06-03 DIAGNOSIS — M25569 Pain in unspecified knee: Secondary | ICD-10-CM | POA: Diagnosis not present

## 2024-06-03 DIAGNOSIS — M545 Low back pain, unspecified: Secondary | ICD-10-CM | POA: Diagnosis not present

## 2024-06-03 DIAGNOSIS — M25559 Pain in unspecified hip: Secondary | ICD-10-CM | POA: Diagnosis not present

## 2024-06-03 DIAGNOSIS — M25579 Pain in unspecified ankle and joints of unspecified foot: Secondary | ICD-10-CM | POA: Diagnosis not present

## 2024-06-10 DIAGNOSIS — M545 Low back pain, unspecified: Secondary | ICD-10-CM | POA: Diagnosis not present

## 2024-06-10 DIAGNOSIS — M25579 Pain in unspecified ankle and joints of unspecified foot: Secondary | ICD-10-CM | POA: Diagnosis not present

## 2024-06-10 DIAGNOSIS — M25569 Pain in unspecified knee: Secondary | ICD-10-CM | POA: Diagnosis not present

## 2024-06-10 DIAGNOSIS — M25559 Pain in unspecified hip: Secondary | ICD-10-CM | POA: Diagnosis not present

## 2024-06-16 DIAGNOSIS — N3010 Interstitial cystitis (chronic) without hematuria: Secondary | ICD-10-CM | POA: Diagnosis not present

## 2024-06-17 DIAGNOSIS — M25569 Pain in unspecified knee: Secondary | ICD-10-CM | POA: Diagnosis not present

## 2024-06-17 DIAGNOSIS — M25559 Pain in unspecified hip: Secondary | ICD-10-CM | POA: Diagnosis not present

## 2024-06-17 DIAGNOSIS — M25579 Pain in unspecified ankle and joints of unspecified foot: Secondary | ICD-10-CM | POA: Diagnosis not present

## 2024-06-17 DIAGNOSIS — M545 Low back pain, unspecified: Secondary | ICD-10-CM | POA: Diagnosis not present

## 2024-06-24 DIAGNOSIS — M25559 Pain in unspecified hip: Secondary | ICD-10-CM | POA: Diagnosis not present

## 2024-06-24 DIAGNOSIS — M25579 Pain in unspecified ankle and joints of unspecified foot: Secondary | ICD-10-CM | POA: Diagnosis not present

## 2024-06-24 DIAGNOSIS — M25569 Pain in unspecified knee: Secondary | ICD-10-CM | POA: Diagnosis not present

## 2024-06-24 DIAGNOSIS — M545 Low back pain, unspecified: Secondary | ICD-10-CM | POA: Diagnosis not present

## 2024-07-01 DIAGNOSIS — M25579 Pain in unspecified ankle and joints of unspecified foot: Secondary | ICD-10-CM | POA: Diagnosis not present

## 2024-07-01 DIAGNOSIS — M545 Low back pain, unspecified: Secondary | ICD-10-CM | POA: Diagnosis not present

## 2024-07-01 DIAGNOSIS — M25569 Pain in unspecified knee: Secondary | ICD-10-CM | POA: Diagnosis not present

## 2024-07-01 DIAGNOSIS — M25559 Pain in unspecified hip: Secondary | ICD-10-CM | POA: Diagnosis not present

## 2024-07-06 DIAGNOSIS — M25559 Pain in unspecified hip: Secondary | ICD-10-CM | POA: Diagnosis not present

## 2024-07-06 DIAGNOSIS — M25579 Pain in unspecified ankle and joints of unspecified foot: Secondary | ICD-10-CM | POA: Diagnosis not present

## 2024-07-06 DIAGNOSIS — M25569 Pain in unspecified knee: Secondary | ICD-10-CM | POA: Diagnosis not present

## 2024-07-06 DIAGNOSIS — M545 Low back pain, unspecified: Secondary | ICD-10-CM | POA: Diagnosis not present

## 2024-07-15 DIAGNOSIS — M25569 Pain in unspecified knee: Secondary | ICD-10-CM | POA: Diagnosis not present

## 2024-07-15 DIAGNOSIS — M545 Low back pain, unspecified: Secondary | ICD-10-CM | POA: Diagnosis not present

## 2024-07-15 DIAGNOSIS — M25579 Pain in unspecified ankle and joints of unspecified foot: Secondary | ICD-10-CM | POA: Diagnosis not present

## 2024-07-15 DIAGNOSIS — M25559 Pain in unspecified hip: Secondary | ICD-10-CM | POA: Diagnosis not present

## 2024-07-22 DIAGNOSIS — M25569 Pain in unspecified knee: Secondary | ICD-10-CM | POA: Diagnosis not present

## 2024-07-22 DIAGNOSIS — M25579 Pain in unspecified ankle and joints of unspecified foot: Secondary | ICD-10-CM | POA: Diagnosis not present

## 2024-07-22 DIAGNOSIS — M25559 Pain in unspecified hip: Secondary | ICD-10-CM | POA: Diagnosis not present

## 2024-07-22 DIAGNOSIS — M545 Low back pain, unspecified: Secondary | ICD-10-CM | POA: Diagnosis not present

## 2024-07-29 DIAGNOSIS — M25569 Pain in unspecified knee: Secondary | ICD-10-CM | POA: Diagnosis not present

## 2024-07-29 DIAGNOSIS — M545 Low back pain, unspecified: Secondary | ICD-10-CM | POA: Diagnosis not present

## 2024-07-29 DIAGNOSIS — M25579 Pain in unspecified ankle and joints of unspecified foot: Secondary | ICD-10-CM | POA: Diagnosis not present

## 2024-07-29 DIAGNOSIS — M25559 Pain in unspecified hip: Secondary | ICD-10-CM | POA: Diagnosis not present

## 2024-07-31 DIAGNOSIS — N3010 Interstitial cystitis (chronic) without hematuria: Secondary | ICD-10-CM | POA: Diagnosis not present

## 2024-07-31 DIAGNOSIS — R102 Pelvic and perineal pain unspecified side: Secondary | ICD-10-CM | POA: Diagnosis not present

## 2024-08-12 DIAGNOSIS — M25569 Pain in unspecified knee: Secondary | ICD-10-CM | POA: Diagnosis not present

## 2024-08-12 DIAGNOSIS — M25559 Pain in unspecified hip: Secondary | ICD-10-CM | POA: Diagnosis not present

## 2024-08-12 DIAGNOSIS — M25579 Pain in unspecified ankle and joints of unspecified foot: Secondary | ICD-10-CM | POA: Diagnosis not present

## 2024-08-12 DIAGNOSIS — M545 Low back pain, unspecified: Secondary | ICD-10-CM | POA: Diagnosis not present

## 2024-08-26 DIAGNOSIS — M25579 Pain in unspecified ankle and joints of unspecified foot: Secondary | ICD-10-CM | POA: Diagnosis not present

## 2024-08-26 DIAGNOSIS — M25569 Pain in unspecified knee: Secondary | ICD-10-CM | POA: Diagnosis not present

## 2024-08-26 DIAGNOSIS — M545 Low back pain, unspecified: Secondary | ICD-10-CM | POA: Diagnosis not present

## 2024-08-26 DIAGNOSIS — M25559 Pain in unspecified hip: Secondary | ICD-10-CM | POA: Diagnosis not present

## 2024-09-07 DIAGNOSIS — M25559 Pain in unspecified hip: Secondary | ICD-10-CM | POA: Diagnosis not present

## 2024-09-07 DIAGNOSIS — M25569 Pain in unspecified knee: Secondary | ICD-10-CM | POA: Diagnosis not present

## 2024-09-07 DIAGNOSIS — M545 Low back pain, unspecified: Secondary | ICD-10-CM | POA: Diagnosis not present

## 2024-09-07 DIAGNOSIS — M25579 Pain in unspecified ankle and joints of unspecified foot: Secondary | ICD-10-CM | POA: Diagnosis not present

## 2024-09-08 DIAGNOSIS — E559 Vitamin D deficiency, unspecified: Secondary | ICD-10-CM | POA: Diagnosis not present

## 2024-09-08 DIAGNOSIS — Z79899 Other long term (current) drug therapy: Secondary | ICD-10-CM | POA: Diagnosis not present

## 2024-09-08 DIAGNOSIS — E038 Other specified hypothyroidism: Secondary | ICD-10-CM | POA: Diagnosis not present

## 2024-09-08 DIAGNOSIS — E785 Hyperlipidemia, unspecified: Secondary | ICD-10-CM | POA: Diagnosis not present

## 2024-09-21 DIAGNOSIS — E038 Other specified hypothyroidism: Secondary | ICD-10-CM | POA: Diagnosis not present

## 2024-09-21 DIAGNOSIS — M25559 Pain in unspecified hip: Secondary | ICD-10-CM | POA: Diagnosis not present

## 2024-09-21 DIAGNOSIS — Z0000 Encounter for general adult medical examination without abnormal findings: Secondary | ICD-10-CM | POA: Diagnosis not present

## 2024-09-21 DIAGNOSIS — E785 Hyperlipidemia, unspecified: Secondary | ICD-10-CM | POA: Diagnosis not present

## 2024-09-21 DIAGNOSIS — E559 Vitamin D deficiency, unspecified: Secondary | ICD-10-CM | POA: Diagnosis not present

## 2024-09-21 DIAGNOSIS — M25569 Pain in unspecified knee: Secondary | ICD-10-CM | POA: Diagnosis not present

## 2024-09-21 DIAGNOSIS — M25579 Pain in unspecified ankle and joints of unspecified foot: Secondary | ICD-10-CM | POA: Diagnosis not present

## 2024-09-21 DIAGNOSIS — M545 Low back pain, unspecified: Secondary | ICD-10-CM | POA: Diagnosis not present

## 2024-09-24 DIAGNOSIS — Z1231 Encounter for screening mammogram for malignant neoplasm of breast: Secondary | ICD-10-CM | POA: Diagnosis not present

## 2024-09-24 DIAGNOSIS — Z780 Asymptomatic menopausal state: Secondary | ICD-10-CM | POA: Diagnosis not present

## 2024-09-24 DIAGNOSIS — Z13820 Encounter for screening for osteoporosis: Secondary | ICD-10-CM | POA: Diagnosis not present

## 2024-10-01 DIAGNOSIS — M25559 Pain in unspecified hip: Secondary | ICD-10-CM | POA: Diagnosis not present

## 2024-10-01 DIAGNOSIS — M25579 Pain in unspecified ankle and joints of unspecified foot: Secondary | ICD-10-CM | POA: Diagnosis not present

## 2024-10-01 DIAGNOSIS — M545 Low back pain, unspecified: Secondary | ICD-10-CM | POA: Diagnosis not present

## 2024-10-01 DIAGNOSIS — M25569 Pain in unspecified knee: Secondary | ICD-10-CM | POA: Diagnosis not present

## 2024-10-06 DIAGNOSIS — E038 Other specified hypothyroidism: Secondary | ICD-10-CM | POA: Diagnosis not present
